# Patient Record
Sex: Female | Born: 1980 | Race: Black or African American | Hispanic: No | Marital: Married | Smoking: Never smoker
Health system: Southern US, Community
[De-identification: ages and names within clinical notes are randomized; demographics above are authoritative.]

## PROBLEM LIST (undated history)

## (undated) HISTORY — PX: KNEE ARTHROSCOPY W/ ACL RECONSTRUCTION: SHX1858

---

## 2016-09-01 ENCOUNTER — Emergency Department
Admission: EM | Admit: 2016-09-01 | Discharge: 2016-09-01 | Disposition: A | Discharge status: Home / Self Care | Payer: BC Managed Care – PPO | Attending: Emergency Medicine | Admitting: Emergency Medicine

## 2016-09-01 ENCOUNTER — Encounter: Payer: Self-pay | Admitting: Emergency Medicine

## 2016-09-01 ENCOUNTER — Emergency Department: Payer: BC Managed Care – PPO

## 2016-09-01 DIAGNOSIS — S83422A Sprain of lateral collateral ligament of left knee, initial encounter: Secondary | ICD-10-CM | POA: Diagnosis not present

## 2016-09-01 DIAGNOSIS — Y9367 Activity, basketball: Secondary | ICD-10-CM | POA: Diagnosis not present

## 2016-09-01 DIAGNOSIS — S8992XA Unspecified injury of left lower leg, initial encounter: Secondary | ICD-10-CM | POA: Diagnosis present

## 2016-09-01 DIAGNOSIS — X501XXA Overexertion from prolonged static or awkward postures, initial encounter: Secondary | ICD-10-CM | POA: Diagnosis not present

## 2016-09-01 DIAGNOSIS — Y929 Unspecified place or not applicable: Secondary | ICD-10-CM | POA: Diagnosis not present

## 2016-09-01 DIAGNOSIS — M25562 Pain in left knee: Secondary | ICD-10-CM

## 2016-09-01 DIAGNOSIS — Y998 Other external cause status: Secondary | ICD-10-CM | POA: Insufficient documentation

## 2016-09-01 MED ORDER — NAPROXEN 500 MG PO TABS: 500.0000 mg | ORAL_TABLET | Freq: Two times a day (BID) | ORAL | 0 refills | Status: DC

## 2016-09-01 NOTE — Discharge Instructions (Signed)
Follow-up with Dr. Hyacinth MeekerMiller if any continued problems. Naproxen 500 mg twice a day with food. Wear knee immobilizer when up walking. Ice if needed for swelling or pain. No sports for 2 weeks.

## 2016-09-01 NOTE — ED Provider Notes (Signed)
[facility] Emergency Department Provider Note ____________________________________________  Time seen: 9:30 AM  I have reviewed the triage vital signs and the nursing notes.  HISTORY  Chief Complaint  Knee Pain   HPI Holly Williamson is a 36 y.o. female is here complaining of left knee pain. Patient states that she was playing basketball when she injured her knee 2 days ago. Patient states she is infrequently taken ibuprofen has not taken any last 24 hours. Patient is continue to ambulate without any assistance. Patient states that she has had problems with her right knee in the past with a torn anterior cruciate ligament. Currently she rates her pain as 9/10.  History reviewed. No pertinent past medical history.  There are no active problems to display for this patient.   Past Surgical History:  Procedure Laterality Date  . KNEE ARTHROSCOPY W/ ACL RECONSTRUCTION      Prior to Admission medications   Medication Sig Start Date End Date Taking? Authorizing Provider  naproxen (NAPROSYN) 500 MG tablet Take 1 tablet (500 mg total) by mouth 2 (two) times daily with a meal. 09/01/16   Tommi Rumps, PA-C    Allergies Patient has no known allergies.  No family history on file.  Social History Social History  Substance Use Topics  . Smoking status: Never Smoker  . Smokeless tobacco: Not on file  . Alcohol use Not on file    Review of Systems  Constitutional: Negative for fever. Cardiovascular: Negative for chest pain. Respiratory: Negative for shortness of breath. Musculoskeletal: Positive right knee pain. Skin: Negative for rash. Neurological: Negative for headaches, focal weakness or numbness. ____________________________________________  PHYSICAL EXAM:  VITAL SIGNS: ED Triage Vitals  Enc Vitals Group     BP 09/01/16 0843 127/83     Pulse Rate 09/01/16 0843 81     Resp 09/01/16 0843 20     Temp 09/01/16 0843 98.4 F (36.9 C)     Temp  Source 09/01/16 0843 Oral     SpO2 09/01/16 0843 100 %     Weight 09/01/16 0844 230 lb (104.3 kg)     Height 09/01/16 0844 5\' 6"  (1.676 m)     Head Circumference --      Peak Flow --      Pain Score 09/01/16 0843 9     Pain Loc --      Pain Edu? --      Excl. in GC? --     Constitutional: Alert and oriented. Well appearing and in no distress. Head: Normocephalic and atraumatic. Eyes: Conjunctivae are normal.  Neck: No stridor Cardiovascular: Normal rate, regular rhythm. Normal distal pulses. Respiratory: Normal respiratory effort.  Musculoskeletal: On examination of the left knee there is no gross deformity noted. There is no effusion present. Mild crepitus is noted with range of motion. Patient is able to flex and extend with out difficulty. Lateral ligament slightly unstable.  Neurologic:  Normal gait without ataxia. Normal speech and language. No gross focal neurologic deficits are appreciated. Skin:  Skin is warm, dry and intact. No ecchymosis, abrasions, erythema was noted. Psychiatric: Mood and affect are normal. Patient exhibits appropriate insight and judgment. 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 No acute osseous abnormality  identified.   I, Tommi Rumps, personally viewed and evaluated these images (plain  radiographs) as part of my medical decision making, as well as reviewing the written report by the radiologist. ____________________________________________   INITIAL IMPRESSION / ASSESSMENT AND PLAN / ED COURSE  Patient is given naproxen 500 mg twice a day for the food today and taking today. She is also placed in a knee immobilizer. She is instructed to use ice if needed for swelling or pain. She is to follow-up with Dr. Hyacinth MeekerMiller if any  continued problems [facility]. No sports for 2 weeks.    ____________________________________________  FINAL CLINICAL IMPRESSION(S) / ED DIAGNOSES  Final diagnoses:  Acute pain of left knee  Sprain of lateral collateral ligament of left knee, initial encounter     Tommi RumpsSummers, Rhonda L, PA-C 09/01/16 1050    Emily FilbertWilliams, Jonathan E, MD 09/01/16 1128

## 2016-09-01 NOTE — ED Notes (Signed)
See triage note states she twisted her left knee on sat while playing b/b  having pain to lateral and posterior knee  Unable to bear full wt d/t pain

## 2016-09-01 NOTE — ED Triage Notes (Signed)
L knee pain since injured playing basketball 2 days ago.

## 2016-12-26 ENCOUNTER — Emergency Department: Payer: BC Managed Care – PPO

## 2016-12-26 ENCOUNTER — Emergency Department
Admission: EM | Admit: 2016-12-26 | Discharge: 2016-12-26 | Disposition: A | Discharge status: Home / Self Care | Payer: BC Managed Care – PPO | Attending: Emergency Medicine | Admitting: Emergency Medicine

## 2016-12-26 ENCOUNTER — Encounter: Payer: Self-pay | Admitting: Emergency Medicine

## 2016-12-26 DIAGNOSIS — Z79899 Other long term (current) drug therapy: Secondary | ICD-10-CM | POA: Diagnosis not present

## 2016-12-26 DIAGNOSIS — R05 Cough: Secondary | ICD-10-CM | POA: Diagnosis present

## 2016-12-26 DIAGNOSIS — J40 Bronchitis, not specified as acute or chronic: Secondary | ICD-10-CM | POA: Insufficient documentation

## 2016-12-26 MED ORDER — HYDROCOD POLST-CPM POLST ER 10-8 MG/5ML PO SUER: 5.0000 mL | Freq: Two times a day (BID) | ORAL | 0 refills | Status: DC

## 2016-12-26 MED ORDER — ALBUTEROL SULFATE HFA 108 (90 BASE) MCG/ACT IN AERS: 2.0000 | INHALATION_SPRAY | RESPIRATORY_TRACT | 0 refills | Status: AC | PRN

## 2016-12-26 MED ORDER — HYDROCOD POLST-CPM POLST ER 10-8 MG/5ML PO SUER
5.0000 mL | Freq: Once | ORAL | Status: AC
  Administered 2016-12-26: 5 mL via ORAL
  Filled 2016-12-26: qty 5

## 2016-12-26 MED ORDER — METHYLPREDNISOLONE 4 MG PO TBPK: ORAL_TABLET | ORAL | 0 refills | Status: DC

## 2016-12-26 MED ORDER — PREDNISONE 20 MG PO TABS
40.0000 mg | ORAL_TABLET | Freq: Once | ORAL | Status: AC
  Administered 2016-12-26: 40 mg via ORAL
  Filled 2016-12-26: qty 2

## 2016-12-26 NOTE — ED Provider Notes (Signed)
[facility] Emergency Department Provider Note   ____________________________________________   First MD Initiated Contact with Patient 12/26/16 [phone]     (approximate)  I have reviewed the triage vital signs and the nursing notes.   HISTORY  Chief Complaint Cough    HPI Holly Williamson is a 36 y.o. female who presents to the ED from home with a chief complaint of cough. Patient reports a two-week history of cough, initially dry, now productive of yellow sputum. Symptoms associated with chest tightness only when coughing. Complains of subjective fevers at home and occasional wheezing noise in her lungs.. Denies associated shortness of breath, abdominal pain, nausea, vomiting.   Past medical history None  There are no active problems to display for this patient.   Past Surgical History:  Procedure Laterality Date  . KNEE ARTHROSCOPY W/ ACL RECONSTRUCTION      Prior to Admission medications   Medication Sig Start Date End Date Taking? Authorizing Provider  albuterol (PROVENTIL HFA;VENTOLIN HFA) 108 (90 Base) MCG/ACT inhaler Inhale 2 puffs into the lungs every 4 (four) hours as needed for wheezing or shortness of breath. 12/26/16   Irean Hong, MD  chlorpheniramine-HYDROcodone (TUSSIONEX PENNKINETIC ER) 10-8 MG/5ML SUER Take 5 mLs by mouth 2 (two) times daily. 12/26/16   Irean Hong, MD  methylPREDNISolone (MEDROL DOSEPAK) 4 MG TBPK tablet Take as directed 12/26/16   Irean Hong, MD  naproxen (NAPROSYN) 500 MG tablet Take 1 tablet (500 mg total) by mouth 2 (two) times daily with a meal. 09/01/16   Tommi Rumps, PA-C    Allergies Patient has no known allergies.  No family history on file.  Social History Social History  Substance Use Topics  . Smoking status: Never Smoker  . Smokeless tobacco: Never Used  . Alcohol use Not on file  smoker  Review of Systems  Constitutional: positive for subjective fever/chills. Eyes: No visual  changes. ENT: No sore throat. Cardiovascular: Denies chest pain. Respiratory: positive for productive cough and wheezing. Denies shortness of breath. Gastrointestinal: No abdominal pain.  No nausea, no vomiting.  No diarrhea.  No constipation. Genitourinary: Negative for dysuria. Musculoskeletal: Negative for back pain. Skin: Negative for rash. Neurological: Negative for headaches, focal weakness or numbness.   ____________________________________________   PHYSICAL EXAM:  VITAL SIGNS: ED Triage Vitals  Enc Vitals Group     BP 12/26/16 0509 (!) 145/99     Pulse Rate 12/26/16 0509 87     Resp 12/26/16 0509 13     Temp 12/26/16 0509 98.2 F (36.8 C)     Temp Source 12/26/16 0509 Oral     SpO2 12/26/16 0509 99 %     Weight 12/26/16 0502 210 lb (95.3 kg)     Height 12/26/16 0502  (1.676 m)     Head Circumference --      Peak Flow --      Pain Score 12/26/16 0502 9     Pain Loc --      Pain Edu? --      Excl. in GC? --     Constitutional: Alert and oriented. Well appearing and in no acute distress. Eyes: Conjunctivae are normal. PERRL. EOMI. Head: Atraumatic. Nose: No congestion/rhinnorhea. Mouth/Throat: Mucous membranes are moist.  Oropharynx non-erythematous. Neck: No stridor.   Cardiovascular: Normal rate, regular rhythm. Grossly normal heart sounds.  Good peripheral circulation. Respiratory: Normal respiratory effort.  No retractions. Lungs CTAB. Loose cough noted. Gastrointestinal: Soft and nontender. No distention.  No abdominal bruits. No CVA tenderness. Musculoskeletal: No lower extremity tenderness nor edema.  No joint effusions. Neurologic:  Normal speech and language. No gross focal neurologic deficits are appreciated. No gait instability. Skin:  Skin is warm, dry and intact. No rash noted. Psychiatric: Mood and affect are normal. Speech and behavior are normal.  ____________________________________________   LABS (all labs ordered are listed, but only  abnormal results are displayed)  Labs Reviewed - No data to display ____________________________________________  EKG  ED ECG REPORT I, Fong Mccarry J, the attending physician, personally viewed and interpreted this ECG.   Date: 12/26/2016  EKG Time: 0508  Rate: 79  Rhythm: normal EKG, normal sinus rhythm  Axis: normal  Intervals:none  ST&T Change: nonspecific  ____________________________________________  RADIOLOGY  Dg Chest 2 View  Result Date: 12/26/2016 CLINICAL DATA:  Cough and chest pain. EXAM: CHEST  2 VIEW COMPARISON:  None. FINDINGS: The cardiomediastinal contours are normal. Mild central bronchial thickening. Pulmonary vasculature is normal. No consolidation, pleural effusion, or pneumothorax. No acute osseous abnormalities are seen. IMPRESSION: Mild central bronchial thickening. Electronically Signed   By: Rubye Oaks M.D.   On: 12/26/2016 05:39    ____________________________________________   PROCEDURES  Procedure(s) performed: None  Procedures  Critical Care performed: No  ____________________________________________   INITIAL IMPRESSION / ASSESSMENT AND PLAN / ED COURSE  Pertinent labs & imaging results that were available during my care of the patient were reviewed by me and considered in my medical decision making (see chart for details).  36 year old female who presents with bronchitis. Will initiate Medrol Dosepak, albuterol inhaler as needed, Tussionex as needed for cough. Strict return precautions given. Patient and family member verbalize understanding and agree with plan of care.      ____________________________________________   FINAL CLINICAL IMPRESSION(S) / ED DIAGNOSES  Final diagnoses:  Bronchitis      NEW MEDICATIONS STARTED DURING THIS VISIT:  New Prescriptions   ALBUTEROL (PROVENTIL HFA;VENTOLIN HFA) 108 (90 BASE) MCG/ACT INHALER    Inhale 2 puffs into the lungs every 4 (four) hours as needed for wheezing or shortness  of breath.   CHLORPHENIRAMINE-HYDROCODONE (TUSSIONEX PENNKINETIC ER) 10-8 MG/5ML SUER    Take 5 mLs by mouth 2 (two) times daily.   METHYLPREDNISOLONE (MEDROL DOSEPAK) 4 MG TBPK TABLET    Take as directed     Note:  This document was prepared using Dragon voice recognition software and may include unintentional dictation errors.    Irean Hong, MD 12/26/16 [phone]

## 2016-12-26 NOTE — Discharge Instructions (Signed)
1. Take steroid taper as prescribed (Medrol Dosepak). 2. Use albuterol inhaler 2 puffs every 4 hours as needed for wheezing. 3. You may take Tussionex as needed for cough. 4. Return to the ER for worsening symptoms, persistent vomiting, difficulty breathing or other concerns.

## 2016-12-26 NOTE — ED Triage Notes (Signed)
Patient ambulatory to triage with steady gait, without difficulty or distress noted; pt reports prod cough yellow sputum with chest tightness

## 2017-04-21 ENCOUNTER — Emergency Department: Payer: BC Managed Care – PPO

## 2017-04-21 ENCOUNTER — Encounter: Payer: Self-pay | Admitting: Emergency Medicine

## 2017-04-21 ENCOUNTER — Other Ambulatory Visit: Payer: Self-pay

## 2017-04-21 ENCOUNTER — Emergency Department
Admission: EM | Admit: 2017-04-21 | Discharge: 2017-04-21 | Disposition: A | Discharge status: Home / Self Care | Payer: BC Managed Care – PPO | Attending: Emergency Medicine | Admitting: Emergency Medicine

## 2017-04-21 DIAGNOSIS — Z79891 Long term (current) use of opiate analgesic: Secondary | ICD-10-CM | POA: Diagnosis not present

## 2017-04-21 DIAGNOSIS — Y9367 Activity, basketball: Secondary | ICD-10-CM | POA: Insufficient documentation

## 2017-04-21 DIAGNOSIS — S161XXA Strain of muscle, fascia and tendon at neck level, initial encounter: Secondary | ICD-10-CM | POA: Insufficient documentation

## 2017-04-21 DIAGNOSIS — Y92310 Basketball court as the place of occurrence of the external cause: Secondary | ICD-10-CM | POA: Diagnosis not present

## 2017-04-21 DIAGNOSIS — Y999 Unspecified external cause status: Secondary | ICD-10-CM | POA: Insufficient documentation

## 2017-04-21 DIAGNOSIS — X503XXA Overexertion from repetitive movements, initial encounter: Secondary | ICD-10-CM | POA: Insufficient documentation

## 2017-04-21 DIAGNOSIS — Z791 Long term (current) use of non-steroidal anti-inflammatories (NSAID): Secondary | ICD-10-CM | POA: Insufficient documentation

## 2017-04-21 DIAGNOSIS — S199XXA Unspecified injury of neck, initial encounter: Secondary | ICD-10-CM | POA: Diagnosis present

## 2017-04-21 MED ORDER — TRAMADOL HCL 50 MG PO TABS: 50.0000 mg | ORAL_TABLET | Freq: Four times a day (QID) | ORAL | 0 refills | Status: DC | PRN

## 2017-04-21 MED ORDER — IBUPROFEN 600 MG PO TABS: 600.0000 mg | ORAL_TABLET | Freq: Three times a day (TID) | ORAL | 0 refills | Status: DC | PRN

## 2017-04-21 MED ORDER — CYCLOBENZAPRINE HCL 10 MG PO TABS: 10.0000 mg | ORAL_TABLET | Freq: Three times a day (TID) | ORAL | 0 refills | Status: DC | PRN

## 2017-04-21 NOTE — ED Triage Notes (Signed)
Pt to ed with c/o back pain and neck pain x 2 weeks.  Pt states she played basketball about 2 weeks ago and has had pain since.

## 2017-04-21 NOTE — ED Provider Notes (Signed)
[facility] Emergency Department Provider Note   ____________________________________________   First MD Initiated Contact with Patient 04/21/17 1640     (approximate)  I have reviewed the triage vital signs and the nursing notes.   HISTORY  Chief Complaint Back Pain    HPI Holly Williamson is a 37 y.o. female patient complain of neck and upper back pain for 2 weeks. Onset of complaint of pain started after playing basketball 2 weeks ago. Patient stated decreased range of motion with left lateral movements of flexion of the neck. Patient denies loss of sensation. Patient stated pain radiates to the right upper shoulder. Patient rates pain as a 10 over 10. Patient described a pain as "achy". No palliative measures for complaint.   History reviewed. No pertinent past medical history.  There are no active problems to display for this patient.   Past Surgical History:  Procedure Laterality Date  . KNEE ARTHROSCOPY W/ ACL RECONSTRUCTION      Prior to Admission medications   Medication Sig Start Date End Date Taking? Authorizing Provider  albuterol (PROVENTIL HFA;VENTOLIN HFA) 108 (90 Base) MCG/ACT inhaler Inhale 2 puffs into the lungs every 4 (four) hours as needed for wheezing or shortness of breath. 12/26/16   Irean HongSung, Jade J, MD  chlorpheniramine-HYDROcodone (TUSSIONEX PENNKINETIC ER) 10-8 MG/5ML SUER Take 5 mLs by mouth 2 (two) times daily. 12/26/16   Irean HongSung, Jade J, MD  cyclobenzaprine (FLEXERIL) 10 MG tablet Take 1 tablet (10 mg total) by mouth 3 (three) times daily as needed. 04/21/17   Joni ReiningSmith, Genesys Coggeshall K, PA-C  ibuprofen (ADVIL,MOTRIN) 600 MG tablet Take 1 tablet (600 mg total) by mouth every 8 (eight) hours as needed. 04/21/17   Joni ReiningSmith, Maikel Neisler K, PA-C  methylPREDNISolone (MEDROL DOSEPAK) 4 MG TBPK tablet Take as directed 12/26/16   Irean HongSung, Jade J, MD  naproxen (NAPROSYN) 500 MG tablet Take 1 tablet (500 mg total) by mouth 2 (two) times daily with a meal. 09/01/16    Tommi RumpsSummers, Rhonda L, PA-C  traMADol (ULTRAM) 50 MG tablet Take 1 tablet (50 mg total) by mouth every 6 (six) hours as needed. 04/21/17 04/21/18  Joni ReiningSmith, Zahira Brummond K, PA-C    Allergies Patient has no known allergies.  No family history on file.  Social History Social History   Tobacco Use  . Smoking status: Never Smoker  . Smokeless tobacco: Never Used  Substance Use Topics  . Alcohol use: Yes    Frequency: Never  . Drug use: No    Review of Systems Constitutional: No fever/chills Eyes: No visual changes. ENT: No sore throat. Cardiovascular: Denies chest pain. Respiratory: Denies shortness of breath. Gastrointestinal: No abdominal pain.  No nausea, no vomiting.  No diarrhea.  No constipation. Genitourinary: Negative for dysuria. Musculoskeletal: Positive for neck pain. Skin: Negative for rash. Neurological: Negative for headaches, focal weakness or numbness.   ____________________________________________   PHYSICAL EXAM:  VITAL SIGNS: ED Triage Vitals  Enc Vitals Group     BP 04/21/17 1623 (!) 152/96     Pulse Rate 04/21/17 1623 82     Resp 04/21/17 1623 18     Temp 04/21/17 1623 98.2 F (36.8 C)     Temp Source 04/21/17 1623 Oral     SpO2 04/21/17 1623 100 %     Weight 04/21/17 1605 210 lb (95.3 kg)     Height 04/21/17 1605 5\' 6"  (1.676 m)     Head Circumference --      Peak Flow --  Pain Score 04/21/17 1605 10     Pain Loc --      Pain Edu? --      Excl. in GC? --    Constitutional: Alert and oriented. Well appearing and in no acute distress. Mouth/Throat: Mucous membranes are moist.  Oropharynx non-erythematous. Neck: No stridor.  No cervical spine tenderness to palpation. Decreased range  of motion with lateral movements. Cardiovascular: Normal rate, regular rhythm. Grossly normal heart sounds.  Good peripheral circulation. Respiratory: Normal respiratory effort.  No retractions. Lungs CTAB. Gastrointestinal: Soft and nontender. No distention. No abdominal  bruits. No CVA tenderness. Musculoskeletal: No lower extremity tenderness nor edema.  No joint effusions. Neurologic:  Normal speech and language. No gross focal neurologic deficits are appreciated. No gait instability. Skin:  Skin is warm, dry and intact. No rash noted. Psychiatric: Mood and affect are normal. Speech and behavior are normal.  ____________________________________________   LABS (all labs ordered are listed, but only abnormal results are displayed)  Labs Reviewed - No data to display ____________________________________________  EKG   ____________________________________________  RADIOLOGY  Dg Cervical Spine Complete  Result Date: 04/21/2017 CLINICAL DATA:  neck pain x 2 weeks. Pt states she played basketball about 2 weeks ago and has had pain since, pain with turning head EXAM: CERVICAL SPINE - COMPLETE 4+ VIEW COMPARISON:  None. FINDINGS: There is no evidence of cervical spine fracture or prevertebral soft tissue swelling. Alignment is normal. No other significant bone abnormalities are identified. Missing teeth and dental restoration. IMPRESSION: Negative cervical spine radiographs. Electronically Signed   By: Corlis Leak M.D.   On: 04/21/2017 17:22    ____________________________________________   PROCEDURES  Procedure(s) performed: None  Procedures  Critical Care performed: No  ____________________________________________   INITIAL IMPRESSION / ASSESSMENT AND PLAN / ED COURSE  As part of my medical decision making, I reviewed the following data within the electronic MEDICAL RECORD NUMBER   Lateral neck pain secondary to strain. Discussed negative x-ray finding with patient. Patient given discharge care instructions. Patient advised to take medication as directed and follow-up with the [facility] clinic if condition persists.      ____________________________________________   FINAL CLINICAL IMPRESSION(S) / ED DIAGNOSES  Final diagnoses:  Cervical  strain, acute, initial encounter     ED Discharge Orders        Ordered    traMADol (ULTRAM) 50 MG tablet  Every 6 hours PRN     04/21/17 1737    cyclobenzaprine (FLEXERIL) 10 MG tablet  3 times daily PRN     04/21/17 1737    ibuprofen (ADVIL,MOTRIN) 600 MG tablet  Every 8 hours PRN     04/21/17 1737       Note:  This document was prepared using Dragon voice recognition software and may include unintentional dictation errors.    Joni Reining, PA-C 04/21/17 1740    Merrily Brittle, MD 04/21/17 Jerene Bears

## 2017-04-21 NOTE — ED Notes (Signed)
Pt with neck pain x 2 weeks. radiates down right arm. No weakness or loss on motion.

## 2017-06-03 ENCOUNTER — Emergency Department
Admission: EM | Admit: 2017-06-03 | Discharge: 2017-06-03 | Disposition: A | Discharge status: Home / Self Care | Payer: BC Managed Care – PPO | Attending: Emergency Medicine | Admitting: Emergency Medicine

## 2017-06-03 ENCOUNTER — Emergency Department: Payer: BC Managed Care – PPO

## 2017-06-03 ENCOUNTER — Other Ambulatory Visit: Payer: Self-pay

## 2017-06-03 DIAGNOSIS — M25562 Pain in left knee: Secondary | ICD-10-CM | POA: Insufficient documentation

## 2017-06-03 DIAGNOSIS — Z79899 Other long term (current) drug therapy: Secondary | ICD-10-CM | POA: Diagnosis not present

## 2017-06-03 MED ORDER — MELOXICAM 15 MG PO TABS: 15.0000 mg | ORAL_TABLET | Freq: Every day | ORAL | 0 refills | Status: DC

## 2017-06-03 MED ORDER — TRAMADOL HCL 50 MG PO TABS: 50.0000 mg | ORAL_TABLET | Freq: Four times a day (QID) | ORAL | 0 refills | Status: DC | PRN

## 2017-06-03 NOTE — ED Provider Notes (Signed)
[facility] Emergency Department Provider Note ____________________________________________  Time seen: Approximately 4:58 PM  I have reviewed the triage vital signs and the nursing notes.   HISTORY  Chief Complaint Knee Pain    HPI Holly Williamson is a 37 y.o. female who presents to the emergency department for evaluation and treatment of left knee pain. She was moving something into a [employer] last night and the dolly rolled back and hit her knee causing pain and swelling. She has a history of left ACL repair. Ibuprofen 600mg  is not helping her pain.  No past medical history on file.  There are no active problems to display for this patient.   Past Surgical History:  Procedure Laterality Date  . KNEE ARTHROSCOPY W/ ACL RECONSTRUCTION      Prior to Admission medications   Medication Sig Start Date End Date Taking? Authorizing Provider  albuterol (PROVENTIL HFA;VENTOLIN HFA) 108 (90 Base) MCG/ACT inhaler Inhale 2 puffs into the lungs every 4 (four) hours as needed for wheezing or shortness of breath. 12/26/16   Irean HongSung, Jade J, MD  chlorpheniramine-HYDROcodone (TUSSIONEX PENNKINETIC ER) 10-8 MG/5ML SUER Take 5 mLs by mouth 2 (two) times daily. 12/26/16   Irean HongSung, Jade J, MD  cyclobenzaprine (FLEXERIL) 10 MG tablet Take 1 tablet (10 mg total) by mouth 3 (three) times daily as needed. 04/21/17   Joni ReiningSmith, Ronald K, PA-C  ibuprofen (ADVIL,MOTRIN) 600 MG tablet Take 1 tablet (600 mg total) by mouth every 8 (eight) hours as needed. 04/21/17   Joni ReiningSmith, Ronald K, PA-C  methylPREDNISolone (MEDROL DOSEPAK) 4 MG TBPK tablet Take as directed 12/26/16   Irean HongSung, Jade J, MD  naproxen (NAPROSYN) 500 MG tablet Take 1 tablet (500 mg total) by mouth 2 (two) times daily with a meal. 09/01/16   Tommi RumpsSummers, Rhonda L, PA-C  traMADol (ULTRAM) 50 MG tablet Take 1 tablet (50 mg total) by mouth every 6 (six) hours as needed. 04/21/17 04/21/18  Joni ReiningSmith, Ronald K, PA-C    Allergies Patient has no known  allergies.  No family history on file.  Social History Social History   Tobacco Use  . Smoking status: Never Smoker  . Smokeless tobacco: Never Used  Substance Use Topics  . Alcohol use: Yes    Frequency: Never  . Drug use: No    Review of Systems Constitutional: Negative for recent illness or injury Cardiovascular: Negative for chest pain or shortness of breath Respiratory: Negative for cough Musculoskeletal: Positive for left knee pain Skin: Negative for rash, lesion, or wound Neurological: Negative for paresthesias.  ____________________________________________   PHYSICAL EXAM:  VITAL SIGNS: ED Triage Vitals  Enc Vitals Group     BP 06/03/17 1600 (!) 144/85     Pulse Rate 06/03/17 1600 76     Resp 06/03/17 1600 18     Temp 06/03/17 1600 98.7 F (37.1 C)     Temp Source 06/03/17 1600 Oral     SpO2 06/03/17 1600 100 %     Weight 06/03/17 1601 212 lb (96.2 kg)     Height 06/03/17 1601 5\' 6"  (1.676 m)     Head Circumference --      Peak Flow --      Pain Score 06/03/17 1601 9     Pain Loc --      Pain Edu? --      Excl. in GC? --     Constitutional: Alert and oriented. Well appearing and in no acute distress. Eyes: Conjunctivae are clear without discharge or  drainage Head: Atraumatic Neck: Supple. Respiratory: Respirations are even and unlabored. Musculoskeletal: Pain in the left knee increases with flexion.  Pain increases with varus stress. Negative Lachman's test. Negative ballottement.  Neurologic: Motor and sensory function is intact.  Skin: Intact, specifically over the left knee. No significant edema or joint effusion appreciated.  Psychiatric: Affect and behavior are intact.  ____________________________________________   LABS (all labs ordered are listed, but only abnormal results are displayed)  Labs Reviewed - No data to display ____________________________________________  RADIOLOGY  Image of the left knee shows osteoarthritis, but is  otherwise negative for acute abnormality per radiology. I, Kem Boroughs, personally viewed and evaluated these images (plain radiographs) as part of my medical decision making, as well as reviewing the written report by the radiologist.  ___________________________________________   PROCEDURES  Procedures  ____________________________________________   INITIAL IMPRESSION / ASSESSMENT AND PLAN / ED COURSE  Holly Williamson is a 37 y.o. female who presents to the emergency department for evaluation of left knee pain.  Patient states that she was moving something up into a [employer] truck and the dolly rolled back and struck her in the knee.  She has had pain with ambulation and flexion since that time.  She has taken ibuprofen 600 mg without relief.  Images of the left knee today show no concern for acute bony abnormality.  She was placed in a knee immobilizer and prescriptions for meloxicam and tramadol are to be given.  She is to follow-up with the orthopedic doctor for symptoms that are not improving over the week.  She is to return to the emergency department for symptoms of change or worsen if she is unable to schedule an appointment.  Medications - No data to display  Pertinent labs & imaging results that were available during my care of the patient were reviewed by me and considered in my medical decision making (see chart for details).  _________________________________________   FINAL CLINICAL IMPRESSION(S) / ED DIAGNOSES  Final diagnoses:  Acute pain of left knee    ED Discharge Orders    None       If controlled substance prescribed during this visit, 12 month history viewed on the [facility] prior to issuing an initial prescription for Schedule II or III opiod.    Chinita Pester, FNP 06/03/17 Ramiro Harvest, [place], MD 06/03/17 [phone]

## 2017-06-03 NOTE — ED Triage Notes (Signed)
She arrives today ambulatory to triage with reports of left sided knee pain  Pt was moving something onto a [employer] truck last pm when the dolly slid and resulted in injury to her left knee  Edema and 9/10 pain reported

## 2017-07-06 ENCOUNTER — Emergency Department: Payer: BC Managed Care – PPO

## 2017-07-06 ENCOUNTER — Encounter: Payer: Self-pay | Admitting: Emergency Medicine

## 2017-07-06 ENCOUNTER — Emergency Department
Admission: EM | Admit: 2017-07-06 | Discharge: 2017-07-06 | Disposition: A | Discharge status: Home / Self Care | Payer: BC Managed Care – PPO | Attending: Emergency Medicine | Admitting: Emergency Medicine

## 2017-07-06 ENCOUNTER — Other Ambulatory Visit: Payer: Self-pay

## 2017-07-06 DIAGNOSIS — M25562 Pain in left knee: Secondary | ICD-10-CM | POA: Diagnosis present

## 2017-07-06 DIAGNOSIS — Z79899 Other long term (current) drug therapy: Secondary | ICD-10-CM | POA: Diagnosis not present

## 2017-07-06 DIAGNOSIS — M1712 Unilateral primary osteoarthritis, left knee: Secondary | ICD-10-CM | POA: Diagnosis not present

## 2017-07-06 MED ORDER — HYDROCODONE-ACETAMINOPHEN 5-325 MG PO TABS: 1.0000 | ORAL_TABLET | Freq: Four times a day (QID) | ORAL | 0 refills | Status: AC | PRN

## 2017-07-06 NOTE — ED Provider Notes (Signed)
[facility] Emergency Department Provider Note ____________________________________________  Time seen: Approximately 3:44 PM  I have reviewed the triage vital signs and the nursing notes.   HISTORY  Chief Complaint Knee Pain    HPI Gurney Maxiniletha Situ is a 37 y.o. female who presents to the emergency department for evaluation and treatment of left knee pain.  No specific injury.  She states that she has been walking more and has played some basketball but has not sustained any falls or twisting injuries.  She does state that she had a partial ACL tear in 1998 and this feels similar. She is taking meloxicam and tramadol without relief. History reviewed. No pertinent past medical history.  There are no active problems to display for this patient.   Past Surgical History:  Procedure Laterality Date  . KNEE ARTHROSCOPY W/ ACL RECONSTRUCTION      Prior to Admission medications   Medication Sig Start Date End Date Taking? Authorizing Provider  albuterol (PROVENTIL HFA;VENTOLIN HFA) 108 (90 Base) MCG/ACT inhaler Inhale 2 puffs into the lungs every 4 (four) hours as needed for wheezing or shortness of breath. 12/26/16   Irean HongSung, Jade J, MD  chlorpheniramine-HYDROcodone (TUSSIONEX PENNKINETIC ER) 10-8 MG/5ML SUER Take 5 mLs by mouth 2 (two) times daily. 12/26/16   Irean HongSung, Jade J, MD  cyclobenzaprine (FLEXERIL) 10 MG tablet Take 1 tablet (10 mg total) by mouth 3 (three) times daily as needed. 04/21/17   Joni ReiningSmith, Ronald K, PA-C  HYDROcodone-acetaminophen (NORCO/VICODIN) 5-325 MG tablet Take 1 tablet by mouth every 6 (six) hours as needed for moderate pain. 07/06/17 07/06/18  Le Faulcon, Rulon Eisenmengerari B, FNP  ibuprofen (ADVIL,MOTRIN) 600 MG tablet Take 1 tablet (600 mg total) by mouth every 8 (eight) hours as needed. 04/21/17   Joni ReiningSmith, Ronald K, PA-C  meloxicam (MOBIC) 15 MG tablet Take 1 tablet (15 mg total) by mouth daily. 06/03/17   Kem Boroughsriplett, Capone Schwinn B, FNP  methylPREDNISolone (MEDROL DOSEPAK) 4 MG TBPK  tablet Take as directed 12/26/16   Irean HongSung, Jade J, MD  naproxen (NAPROSYN) 500 MG tablet Take 1 tablet (500 mg total) by mouth 2 (two) times daily with a meal. 09/01/16   Tommi RumpsSummers, Rhonda L, PA-C    Allergies Patient has no known allergies.  History reviewed. No pertinent family history.  Social History Social History   Tobacco Use  . Smoking status: Never Smoker  . Smokeless tobacco: Never Used  Substance Use Topics  . Alcohol use: Yes    Frequency: Never  . Drug use: No    Review of Systems Constitutional: Negative for fever. Cardiovascular: Negative for chest pain. Respiratory: Negative for shortness of breath. Musculoskeletal: Positive for left knee pain. Skin: Negative for wounds or lesions.  Neurological: Negative for decrease in sensation  ____________________________________________   PHYSICAL EXAM:  VITAL SIGNS: ED Triage Vitals  Enc Vitals Group     BP 07/06/17 1534 121/87     Pulse Rate 07/06/17 1534 88     Resp 07/06/17 1534 20     Temp 07/06/17 1534 99.4 F (37.4 C)     Temp Source 07/06/17 1534 Oral     SpO2 07/06/17 1534 99 %     Weight 07/06/17 1535 220 lb (99.8 kg)     Height 07/06/17 1535 5\' 6"  (1.676 m)     Head Circumference --      Peak Flow --      Pain Score 07/06/17 1534 9     Pain Loc --  Pain Edu? --      Excl. in GC? --     Constitutional: Alert and oriented. Well appearing and in no acute distress. Eyes: Conjunctivae are clear without discharge or drainage Head: Atraumatic Neck: Unrestricted ROM observed. Respiratory: No cough. Respirations are even and unlabored. Musculoskeletal: Left knee pain with attempt to flex or fully extend. Prepatellar effusion noted. No crepitus. Neurologic: Motor and sensory is intact.  Skin: Intact without wound or lesion.  Psychiatric: Affect and behavior are appropriate.  ____________________________________________   LABS (all labs ordered are listed, but only abnormal results are  displayed)  Labs Reviewed - No data to display ____________________________________________  RADIOLOGY  Osteoarthritis of the left knee. No acute bony abnormality per radiology. ____________________________________________   PROCEDURES  Procedures  ____________________________________________   INITIAL IMPRESSION / ASSESSMENT AND PLAN / ED COURSE  Gudrun Axe is a 37 y.o. who presents to the emergency department for evaluation of left knee pain. X-ray and exam is consistent with pain secondary to arthritis.  Patient instructed to follow-up with orthopedics when possible.  She was also instructed to return to the emergency department for symptoms that change or worsen if unable schedule an appointment with orthopedics or primary care.  Medications - No data to display  Pertinent labs & imaging results that were available during my care of the patient were reviewed by me and considered in my medical decision making (see chart for details).  _________________________________________   FINAL CLINICAL IMPRESSION(S) / ED DIAGNOSES  Final diagnoses:  Osteoarthritis of left knee, unspecified osteoarthritis type    ED Discharge Orders        Ordered    HYDROcodone-acetaminophen (NORCO/VICODIN) 5-325 MG tablet  Every 6 hours PRN     07/06/17 1724       If controlled substance prescribed during this visit, 12 month history viewed on the [place] prior to issuing an initial prescription for Schedule II or III opiod.    Chinita Pester, FNP 07/06/17 1732    Arnaldo Natal, MD 07/07/17 [phone]

## 2017-07-06 NOTE — ED Triage Notes (Signed)
Pt reports that she noticed that her left knee is burning and achy. She reports that she injured her ACL on right in 1998 and it feels similar. Denies any injury. She reports she has been walking and a little basketball. Pt is able to ambulate without difficulty.

## 2017-07-06 NOTE — ED Notes (Signed)
See triage note presents with pain to left knee  States pain is lateral  And also having some burning type pain posterior knee  Unsure of injury  States she just did some walking yesterday

## 2017-07-06 NOTE — Discharge Instructions (Signed)
Please wear a neoprene knee brace when up and moving around.  Follow up with the orthopedic doctor.

## 2017-10-17 ENCOUNTER — Encounter: Payer: Self-pay | Admitting: Emergency Medicine

## 2017-10-17 ENCOUNTER — Emergency Department: Payer: BC Managed Care – PPO

## 2017-10-17 ENCOUNTER — Emergency Department
Admission: EM | Admit: 2017-10-17 | Discharge: 2017-10-17 | Disposition: A | Discharge status: Home / Self Care | Payer: BC Managed Care – PPO | Attending: Emergency Medicine | Admitting: Emergency Medicine

## 2017-10-17 DIAGNOSIS — M25562 Pain in left knee: Secondary | ICD-10-CM | POA: Insufficient documentation

## 2017-10-17 DIAGNOSIS — Z79899 Other long term (current) drug therapy: Secondary | ICD-10-CM | POA: Insufficient documentation

## 2017-10-17 MED ORDER — MELOXICAM 15 MG PO TABS: 15.0000 mg | ORAL_TABLET | Freq: Every day | ORAL | 1 refills | Status: AC

## 2017-10-17 NOTE — ED Triage Notes (Signed)
Patient states that she was helping a friend move today and twisted her left knee.

## 2017-10-17 NOTE — ED Provider Notes (Signed)
[facility] Emergency Department Provider Note  ____________________________________________  Time seen: Approximately 11:55 PM  I have reviewed the triage vital signs and the nursing notes.   HISTORY  Chief Complaint Knee Pain    HPI Holly Williamson is a 37 y.o. female presents to the emergency department with 8 out of 10 left knee pain that started after patient was helping her friend move.  Patient reports that her pain is localized to the medial aspect of the knee and worsened with extension at the knee.  Patient reports a locking and clicking sensation.  She denies prior traumas or recent falls.  No prior left knee surgeries.  Patient did report an extensive history of basketball in her youth.   History reviewed. No pertinent past medical history.  There are no active problems to display for this patient.   Past Surgical History:  Procedure Laterality Date  . KNEE ARTHROSCOPY W/ ACL RECONSTRUCTION      Prior to Admission medications   Medication Sig Start Date End Date Taking? Authorizing Provider  albuterol (PROVENTIL HFA;VENTOLIN HFA) 108 (90 Base) MCG/ACT inhaler Inhale 2 puffs into the lungs every 4 (four) hours as needed for wheezing or shortness of breath. 12/26/16   Irean HongSung, Jade J, MD  chlorpheniramine-HYDROcodone (TUSSIONEX PENNKINETIC ER) 10-8 MG/5ML SUER Take 5 mLs by mouth 2 (two) times daily. 12/26/16   Irean HongSung, Jade J, MD  cyclobenzaprine (FLEXERIL) 10 MG tablet Take 1 tablet (10 mg total) by mouth 3 (three) times daily as needed. 04/21/17   Joni ReiningSmith, Ronald K, PA-C  HYDROcodone-acetaminophen (NORCO/VICODIN) 5-325 MG tablet Take 1 tablet by mouth every 6 (six) hours as needed for moderate pain. 07/06/17 07/06/18  Triplett, Rulon Eisenmengerari B, FNP  ibuprofen (ADVIL,MOTRIN) 600 MG tablet Take 1 tablet (600 mg total) by mouth every 8 (eight) hours as needed. 04/21/17   Joni ReiningSmith, Ronald K, PA-C  meloxicam (MOBIC) 15 MG tablet Take 1 tablet (15 mg total) by mouth daily for 7  days. 10/17/17 10/24/17  Orvil FeilWoods, Daisja Kessinger M, PA-C  methylPREDNISolone (MEDROL DOSEPAK) 4 MG TBPK tablet Take as directed 12/26/16   Irean HongSung, Jade J, MD  naproxen (NAPROSYN) 500 MG tablet Take 1 tablet (500 mg total) by mouth 2 (two) times daily with a meal. 09/01/16   Tommi RumpsSummers, Rhonda L, PA-C    Allergies Patient has no known allergies.  No family history on file.  Social History Social History   Tobacco Use  . Smoking status: Never Smoker  . Smokeless tobacco: Never Used  Substance Use Topics  . Alcohol use: Yes    Frequency: Never  . Drug use: No     Review of Systems  Constitutional: No fever/chills Eyes: No visual changes. No discharge ENT: No upper respiratory complaints. Cardiovascular: no chest pain. Respiratory: no cough. No SOB. Gastrointestinal: No abdominal pain.  No nausea, no vomiting.  No diarrhea.  No constipation. Genitourinary: Negative for dysuria. No hematuria Musculoskeletal: Patient has left knee pain.  Skin: Negative for rash, abrasions, lacerations, ecchymosis. Neurological: Negative for headaches, focal weakness or numbness.  ____________________________________________   PHYSICAL EXAM:  VITAL SIGNS: ED Triage Vitals [10/17/17 2216]  Enc Vitals Group     BP 135/90     Pulse Rate 83     Resp 18     Temp 98.4 F (36.9 C)     Temp Source Oral     SpO2 99 %     Weight 220 lb (99.8 kg)     Height 5\' 6"  (1.676 m)  Head Circumference      Peak Flow      Pain Score 9     Pain Loc      Pain Edu?      Excl. in GC?      Constitutional: Alert and oriented. Well appearing and in no acute distress. Eyes: Conjunctivae are normal. PERRL. EOMI. Head: Atraumatic. Cardiovascular: Normal rate, regular rhythm. Normal S1 and S2.  Good peripheral circulation. Respiratory: Normal respiratory effort without tachypnea or retractions. Lungs CTAB. Good air entry to the bases with no decreased or absent breath sounds. Musculoskeletal: Patient has tenderness to  palpation over the medial aspect of the knee, left.  Left knee: Negative anterior and posterior drawer test.  Positive McMurray's.  Negative ballottement.  Negative  apprehension.  Palpable dorsalis pedis pulse, left. Neurologic:  Normal speech and language. No gross focal neurologic deficits are appreciated.  Skin:  Skin is warm, dry and intact. No rash noted. Psychiatric: Mood and affect are normal. Speech and behavior are normal. Patient exhibits appropriate insight and judgement.   ____________________________________________   LABS (all labs ordered are listed, but only abnormal results are displayed)  Labs Reviewed - No data to display ____________________________________________  EKG   ____________________________________________  RADIOLOGY I personally viewed and evaluated these images as part of my medical decision making, as well as reviewing the written report by the radiologist.  Dg Knee Complete 4 Views Left  Result Date: 10/17/2017 CLINICAL DATA:  Twisted LEFT knee while helping a friend move today EXAM: LEFT KNEE - COMPLETE 4+ VIEW COMPARISON:  07/06/2017 FINDINGS: Osseous mineralization normal. Joint space narrowing and spur formation at medial compartment. No acute fracture, dislocation, or bone destruction. No knee joint effusion. IMPRESSION: Degenerative changes LEFT knee without acute bony abnormalities. Electronically Signed   By: Ulyses Southward M.D.   On: 10/17/2017 22:47    ____________________________________________    PROCEDURES  Procedure(s) performed:    Procedures    Medications - No data to display   ____________________________________________   INITIAL IMPRESSION / ASSESSMENT AND PLAN / ED COURSE  Pertinent labs & imaging results that were available during my care of the patient were reviewed by me and considered in my medical decision making (see chart for details).  Review of the [place] CSRS was performed in accordance of the [facility] prior to  dispensing any controlled drugs.      Assessment and plan Left knee pain Patient presents to the emergency department with left knee pain that occurred after patient helped a friend move earlier today.  Differential diagnosis included meniscal tear versus ACL strain.  No acute fractures or abnormalities were identified on x-ray examination of the knee.  Patient was discharged with meloxicam and advised to follow-up with orthopedics.  All patient questions were answered.    ____________________________________________  FINAL CLINICAL IMPRESSION(S) / ED DIAGNOSES  Final diagnoses:  Acute pain of left knee      NEW MEDICATIONS STARTED DURING THIS VISIT:  ED Discharge Orders        Ordered    meloxicam (MOBIC) 15 MG tablet  Daily     10/17/17 2341          This chart was dictated using voice recognition software/Dragon. Despite best efforts to proofread, errors can occur which can change the meaning. Any change was purely unintentional.    Orvil Feil, PA-C 10/17/17 2358    Emily Filbert, MD 10/18/17 [phone]

## 2017-10-17 NOTE — ED Notes (Signed)
Patient reports left knee pain after helping friend move.  Reports throbbing pain.  Good left pedal pulse noted.

## 2018-05-24 ENCOUNTER — Other Ambulatory Visit: Payer: Self-pay

## 2018-05-24 DIAGNOSIS — J111 Influenza due to unidentified influenza virus with other respiratory manifestations: Secondary | ICD-10-CM | POA: Insufficient documentation

## 2018-05-24 DIAGNOSIS — Z79899 Other long term (current) drug therapy: Secondary | ICD-10-CM | POA: Insufficient documentation

## 2018-05-24 LAB — COMPREHENSIVE METABOLIC PANEL
ALBUMIN: 3.5 g/dL (ref 3.5–5.0)
ALT: 14 U/L (ref 0–44)
AST: 20 U/L (ref 15–41)
Alkaline Phosphatase: 64 U/L (ref 38–126)
Anion gap: 6 (ref 5–15)
BUN: 13 mg/dL (ref 6–20)
CHLORIDE: 109 mmol/L (ref 98–111)
CO2: 23 mmol/L (ref 22–32)
CREATININE: 0.95 mg/dL (ref 0.44–1.00)
Calcium: 8.5 mg/dL — ABNORMAL LOW (ref 8.9–10.3)
GFR calc Af Amer: >60 mL/min (ref >60)
GLUCOSE: 101 mg/dL — AB (ref 70–99)
Potassium: 3.5 mmol/L (ref 3.5–5.1)
Sodium: 138 mmol/L (ref 135–145)
Total Bilirubin: 0.4 mg/dL (ref 0.3–1.2)
Total Protein: 6.4 g/dL — ABNORMAL LOW (ref 6.5–8.1)

## 2018-05-24 LAB — CBC
HCT: 33.8 % — ABNORMAL LOW (ref 36.0–46.0)
Hemoglobin: 10.5 g/dL — ABNORMAL LOW (ref 12.0–15.0)
MCH: 26.3 pg (ref 26.0–34.0)
MCHC: 31.1 g/dL (ref 30.0–36.0)
MCV: 84.7 fL (ref 80.0–100.0)
NRBC: 0 % (ref 0.0–0.2)
PLATELETS: 348 10*3/uL (ref 150–400)
RBC: 3.99 MIL/uL (ref 3.87–5.11)
RDW: 14 % (ref 11.5–15.5)
WBC: 8.8 10*3/uL (ref 4.0–10.5)

## 2018-05-24 LAB — URINALYSIS, COMPLETE (UACMP) WITH MICROSCOPIC
BILIRUBIN URINE: NEGATIVE
Glucose, UA: NEGATIVE mg/dL
KETONES UR: NEGATIVE mg/dL
Nitrite: NEGATIVE
PH: 6 (ref 5.0–8.0)
PROTEIN: NEGATIVE mg/dL
Specific Gravity, Urine: 1.012 (ref 1.005–1.030)

## 2018-05-24 LAB — LIPASE, BLOOD: LIPASE: 27 U/L (ref 11–51)

## 2018-05-24 LAB — POCT PREGNANCY, URINE: PREG TEST UR: NEGATIVE

## 2018-05-24 NOTE — ED Triage Notes (Signed)
Pt in with co forceful cough and congestion for a few days, today started with n.v.d. no dysuria.

## 2018-05-25 ENCOUNTER — Emergency Department: Payer: Self-pay

## 2018-05-25 ENCOUNTER — Emergency Department
Admission: EM | Admit: 2018-05-25 | Discharge: 2018-05-25 | Disposition: A | Discharge status: Home / Self Care | Payer: Self-pay | Attending: Emergency Medicine | Admitting: Emergency Medicine

## 2018-05-25 DIAGNOSIS — R69 Illness, unspecified: Secondary | ICD-10-CM

## 2018-05-25 DIAGNOSIS — J111 Influenza due to unidentified influenza virus with other respiratory manifestations: Secondary | ICD-10-CM

## 2018-05-25 NOTE — ED Provider Notes (Signed)
[facility] Emergency Department Provider Note  ____________________________________________   First MD Initiated Contact with Patient 05/25/18 [phone]     (approximate)  I have reviewed the triage vital signs and the nursing notes.   HISTORY  Chief Complaint Emesis and Cough    HPI Holly Williamson is a 38 y.o. female with no chronic medical issues who presents for evaluation of several days of a constellation of symptoms including general malaise and fatigue, body aches, nasal congestion and runny nose, cough with some sensation of chest pressure, shortness of breath with exertion, nausea, vomiting, and diarrhea.  She reports that the symptoms were somewhat acute in onset about 3 days ago and nothing in particular makes it better or worse (other than exertion).  The GI symptoms got worse today with about 5 episodes of loose watery stool with no blood as well as one episode of vomiting.  She has not had much appetite but has tried to stay hydrated.  She interacts with the public and thinks that she has been around a few people that have been sick lately.  She has not had any fever or chills of which she is aware.  No past medical history on file.  There are no active problems to display for this patient.   Past Surgical History:  Procedure Laterality Date  . KNEE ARTHROSCOPY W/ ACL RECONSTRUCTION      Prior to Admission medications   Medication Sig Start Date End Date Taking? Authorizing Provider  albuterol (PROVENTIL HFA;VENTOLIN HFA) 108 (90 Base) MCG/ACT inhaler Inhale 2 puffs into the lungs every 4 (four) hours as needed for wheezing or shortness of breath. 12/26/16   Irean Hong, MD  chlorpheniramine-HYDROcodone (TUSSIONEX PENNKINETIC ER) 10-8 MG/5ML SUER Take 5 mLs by mouth 2 (two) times daily. 12/26/16   Irean Hong, MD  cyclobenzaprine (FLEXERIL) 10 MG tablet Take 1 tablet (10 mg total) by mouth 3 (three) times daily as needed. 04/21/17   Joni Reining,  PA-C  HYDROcodone-acetaminophen (NORCO/VICODIN) 5-325 MG tablet Take 1 tablet by mouth every 6 (six) hours as needed for moderate pain. 07/06/17 07/06/18  Triplett, Rulon Eisenmenger B, FNP  ibuprofen (ADVIL,MOTRIN) 600 MG tablet Take 1 tablet (600 mg total) by mouth every 8 (eight) hours as needed. 04/21/17   Joni Reining, PA-C  methylPREDNISolone (MEDROL DOSEPAK) 4 MG TBPK tablet Take as directed 12/26/16   Irean Hong, MD  naproxen (NAPROSYN) 500 MG tablet Take 1 tablet (500 mg total) by mouth 2 (two) times daily with a meal. 09/01/16   Tommi Rumps, PA-C    Allergies Patient has no known allergies.  No family history on file.  Social History Social History   Tobacco Use  . Smoking status: Never Smoker  . Smokeless tobacco: Never Used  Substance Use Topics  . Alcohol use: Yes    Frequency: Never  . Drug use: No    Review of Systems Constitutional: Myalgias, general malaise and fatigue. Eyes: No visual changes. ENT: Mild sore throat.  Nasal congestion and runny nose. Cardiovascular: No chest pain, some pressure associated with cough. Respiratory: Cough, occasional shortness of breath associated with cough and/or exertion. Gastrointestinal: No abdominal pain.  Nausea, vomiting, and diarrhea as described above. Genitourinary: Negative for dysuria. Musculoskeletal: Generalized body aches without any specific location.  Negative for neck pain.  Negative for back pain. Integumentary: Negative for rash. Neurological: Occasional mild generalized headache.  No focal weakness or numbness.   ____________________________________________   PHYSICAL  EXAM:  VITAL SIGNS: ED Triage Vitals [05/24/18 2216]  Enc Vitals Group     BP (!) 141/86     Pulse Rate (!) 104     Resp 20     Temp 98.3 F (36.8 C)     Temp Source Oral     SpO2 100 %     Weight 108.9 kg (240 lb)     Height 1.702 m (5\' 7" )     Head Circumference      Peak Flow      Pain Score 8     Pain Loc      Pain Edu?       Excl. in GC?     Constitutional: Alert and oriented. Well appearing and in no acute distress. Eyes: Conjunctivae are normal.  Head: Atraumatic. Nose: +congestion/rhinnorhea. Mouth/Throat: Mucous membranes are moist.  Oropharynx non-erythematous. Neck: No stridor.  No meningeal signs.   Cardiovascular: Mild tachycardia, regular rhythm. Good peripheral circulation. Grossly normal heart sounds. Respiratory: Normal respiratory effort.  No retractions. Lungs CTAB. Gastrointestinal: Soft and nontender. No distention.  Musculoskeletal: No lower extremity tenderness nor edema. No gross deformities of extremities. Neurologic:  Normal speech and language. No gross focal neurologic deficits are appreciated.  Skin:  Skin is warm, dry and intact. No rash noted. Psychiatric: Mood and affect are normal. Speech and behavior are normal.  ____________________________________________   LABS (all labs ordered are listed, but only abnormal results are displayed)  Labs Reviewed  CBC - Abnormal; Notable for the following components:      Result Value   Hemoglobin 10.5 (*)    HCT 33.8 (*)    All other components within normal limits  COMPREHENSIVE METABOLIC PANEL - Abnormal; Notable for the following components:   Glucose, Bld 101 (*)    Calcium 8.5 (*)    Total Protein 6.4 (*)    All other components within normal limits  URINALYSIS, COMPLETE (UACMP) WITH MICROSCOPIC - Abnormal; Notable for the following components:   Color, Urine YELLOW (*)    APPearance CLEAR (*)    Hgb urine dipstick MODERATE (*)    Leukocytes, UA TRACE (*)    Bacteria, UA RARE (*)    All other components within normal limits  LIPASE, BLOOD  POCT PREGNANCY, URINE  POC URINE PREG, ED   ____________________________________________  EKG  No indication for EKG ____________________________________________  RADIOLOGY I, Loleta Roseory [place], personally viewed and evaluated these images (plain radiographs) as part of my medical  decision making, as well as reviewing the written report by the radiologist.  ED MD interpretation:  No evidence of acute abnormality  Official radiology report(s): Dg Chest 2 View  Result Date: 05/25/2018 CLINICAL DATA:  Cough and nausea and vomiting for 2 days, initial encounter EXAM: CHEST - 2 VIEW COMPARISON:  12/26/2016 FINDINGS: Cardiac shadows within normal limits. The lungs are well aerated bilaterally. No focal infiltrate or sizable effusion is seen. No bony abnormality is seen. IMPRESSION: No active cardiopulmonary disease. Electronically Signed   By: Alcide CleverMark  Lukens M.D.   On: 05/25/2018 01:06    ____________________________________________   PROCEDURES  Critical Care performed: No   Procedure(s) performed:   Procedures   ____________________________________________   INITIAL IMPRESSION / ASSESSMENT AND PLAN / ED COURSE  As part of my medical decision making, I reviewed the following data within the electronic MEDICAL RECORD NUMBER Nursing notes reviewed and incorporated, Labs reviewed , Old chart reviewed, Radiograph reviewed  and Notes from prior ED visits  Differential diagnosis includes, but is not limited to, influenza, influenza-like illness, nonspecific viral illness, pneumonia, much less likely PE or ACS, electrolyte or metabolic abnormality.  The patient is very well-appearing, ambulating without any apparent difficulty, and has a reassuring physical exam with clear lungs, no abdominal tenderness, and no visible or appreciable acute abnormalities on exam.  Given the prevalence of influenza in the community, I strongly believe that is her current issue, but it is so prevalent right now that the hospital system has asked [place] not to test for influenza and less it is in a special population (infants or geriatric patients).  I am checking a chest x-ray but I do not expect there to be pneumonia.  I had my usual customary influenza or influenza-like illness discussion with the  patient.  CBC is normal with no leukocytosis, comprehensive metabolic panel was reassuring, lipase normal, urine pregnancy negative.  She does have a few red blood cells in her urine, but she has no other urinary symptoms and I confirmed with her that she is currently on her menstrual cycle.  Assuming the chest x-ray is negative she is comfortable with the plan for discharge and outpatient follow-up without any medications.  Clinical Course as of May 25 109  Tue May 25, 2018  0109 No indication of pneumonia.  I will discharge for outpatient follow-up as previously planned.  DG Chest 2 View [CF]    Clinical Course User Index [CF] Loleta Rose, MD    ____________________________________________  FINAL CLINICAL IMPRESSION(S) / ED DIAGNOSES  Final diagnoses:  Influenza-like illness     MEDICATIONS GIVEN DURING THIS VISIT:  Medications - No data to display   ED Discharge Orders    None       Note:  This document was prepared using Dragon voice recognition software and may include unintentional dictation errors.   Loleta Rose, MD 05/25/18 0111

## 2018-05-25 NOTE — Discharge Instructions (Signed)
You were diagnosed with an influenza-like viral illness.  You will feel ill for as much as a few weeks.  Please take any prescribed medications as instructed, and you may use over-the-counter Tylenol and/or ibuprofen as needed according to label instructions (unless you have an allergy to either or have been told by your doctor not to take them).  Please make sure to drink plenty of fluids and refer to the included information about rehydration. ° °Follow up with your physician as instructed above, and return to the Emergency Department (ED) if you are unable to tolerate fluids due to vomiting, have worsening trouble breathing, become extremely tired or difficult to awaken, or if you develop any other symptoms that concern you. ° °

## 2018-05-25 NOTE — ED Notes (Signed)
Pt states she has cough and congestion for 2-3 days.  Pt reports bodyaches, chills.  N/v today.  Pt alert  Speech clear.

## 2019-02-06 ENCOUNTER — Encounter: Payer: Self-pay | Admitting: Emergency Medicine

## 2019-02-06 ENCOUNTER — Other Ambulatory Visit: Payer: Self-pay

## 2019-02-06 ENCOUNTER — Emergency Department
Admission: EM | Admit: 2019-02-06 | Discharge: 2019-02-06 | Disposition: A | Discharge status: Home / Self Care | Payer: No Typology Code available for payment source | Attending: Emergency Medicine | Admitting: Emergency Medicine

## 2019-02-06 DIAGNOSIS — R0981 Nasal congestion: Secondary | ICD-10-CM | POA: Diagnosis present

## 2019-02-06 DIAGNOSIS — J069 Acute upper respiratory infection, unspecified: Secondary | ICD-10-CM | POA: Insufficient documentation

## 2019-02-06 MED ORDER — BENZONATATE 200 MG PO CAPS: 200.0000 mg | ORAL_CAPSULE | Freq: Three times a day (TID) | ORAL | 0 refills | Status: AC | PRN

## 2019-02-06 NOTE — ED Triage Notes (Addendum)
Pt c/o of head congestion and cough for the last 2 days, reports nasal drainage yellow in color  Denies hx of allergies, fever, loss of taste or smell, or sweats

## 2019-02-06 NOTE — ED Provider Notes (Signed)
[facility] Emergency Department Provider Note  ____________________________________________   First MD Initiated Contact with Patient 02/06/19 [phone]     (approximate)  I have reviewed the triage vital signs and the nursing notes.   HISTORY  Chief Complaint Nasal Congestion  HPI Holly Williamson is a 38 y.o. female presents to the ED with complaint of head congestion and cough for the last 2 days.  Patient states that there is been yellow nasal drainage.  She denies any fever, chills, nausea or vomiting.  There is been no loss of taste or smell.  Patient also reports sneezing and symptoms are mostly relieved by DayQuil extra strength.  She denies any other Covid symptoms and is unaware of any exposure to Covid.     History reviewed. No pertinent past medical history.  There are no active problems to display for this patient.   Past Surgical History:  Procedure Laterality Date  . KNEE ARTHROSCOPY W/ ACL RECONSTRUCTION      Prior to Admission medications   Medication Sig Start Date End Date Taking? Authorizing Provider  albuterol (PROVENTIL HFA;VENTOLIN HFA) 108 (90 Base) MCG/ACT inhaler Inhale 2 puffs into the lungs every 4 (four) hours as needed for wheezing or shortness of breath. 12/26/16   Irean Hong, MD  benzonatate (TESSALON) 200 MG capsule Take 1 capsule (200 mg total) by mouth 3 (three) times daily as needed for cough. 02/06/19   Tommi Rumps, PA-C    Allergies Patient has no known allergies.  History reviewed. No pertinent family history.  Social History Social History   Tobacco Use  . Smoking status: Never Smoker  . Smokeless tobacco: Never Used  Substance Use Topics  . Alcohol use: Yes    Frequency: Never  . Drug use: No    Review of Systems Constitutional: No fever/chills Eyes: No visual changes. ENT: No sore throat.  Positive for nasal congestion. Cardiovascular: Denies chest pain. Respiratory: Denies shortness of  breath.  Positive for cough. Gastrointestinal: No abdominal pain.  No nausea, no vomiting.  No diarrhea.  Musculoskeletal: Negative for body aches. Skin: Negative for rash. Neurological: Negative for headaches, focal weakness or numbness. ___________________________________________   PHYSICAL EXAM:  VITAL SIGNS: ED Triage Vitals  Enc Vitals Group     BP 02/06/19 0516 125/90     Pulse Rate 02/06/19 0516 86     Resp 02/06/19 0516 16     Temp 02/06/19 0516 98.5 F (36.9 C)     Temp Source 02/06/19 0516 Oral     SpO2 02/06/19 0516 99 %     Weight 02/06/19 0517 220 lb (99.8 kg)     Height 02/06/19 0517 5\' 7"  (1.702 m)     Head Circumference --      Peak Flow --      Pain Score 02/06/19 0518 0     Pain Loc --      Pain Edu? --      Excl. in [place]? --    Constitutional: Alert and oriented. Well appearing and in no acute distress. Eyes: Conjunctivae are normal.  Head: Atraumatic. Nose: Minimal congestion/no rhinnorhea. Mouth/Throat: Mucous membranes are moist.  Oropharynx non-erythematous. Neck: No stridor.   Hematological/Lymphatic/Immunilogical: No cervical lymphadenopathy. Cardiovascular: Normal rate, regular rhythm. Grossly normal heart sounds.  Good peripheral circulation. Respiratory: Normal respiratory effort.  No retractions. Lungs CTAB. Musculoskeletal: Moves upper and lower extremities without difficulty.  Normal gait was noted. Neurologic:  Normal speech and language. No gross focal neurologic  deficits are appreciated. No gait instability. Skin:  Skin is warm, dry and intact. No rash noted. Psychiatric: Mood and affect are normal. Speech and behavior are normal.  ____________________________________________   LABS (all labs ordered are listed, but only abnormal results are displayed)  Labs Reviewed - No data to display   PROCEDURES  Procedure(s) performed (including Critical Care):  Procedures   ____________________________________________   INITIAL  IMPRESSION / ASSESSMENT AND PLAN / ED COURSE  As part of my medical decision making, I reviewed the following data within the electronic MEDICAL RECORD NUMBER Notes from prior ED visits and [place] Controlled Substance Database  37 year old female presents to the ED with complaint of nasal congestion, sneezing, cough without fever, chills, sore throat or GI symptoms.  Patient denies any Covid exposure and states that currently her partner is only sneezing and has seasonal allergies.  Patient was told of the free Covid testing available to her.  O2 level was stable at 99% to 100% and patient was afebrile.  Patient will continue with the DayQuil extra strength as needed for nasal congestion.  A prescription for Tessalon Perles 200 mg every 8 hours as needed for cough was sent to her pharmacy.  She is to return to the emergency department or [facility] clinic if any continued problems.  ____________________________________________   FINAL CLINICAL IMPRESSION(S) / ED DIAGNOSES  Final diagnoses:  Acute upper respiratory infection     ED Discharge Orders         Ordered    benzonatate (TESSALON) 200 MG capsule  3 times daily PRN     02/06/19 0743           Note:  This document was prepared using Dragon voice recognition software and may include unintentional dictation errors.    Johnn Hai, PA-C 02/06/19 1032    Vanessa [place], MD 02/06/19 1135

## 2019-02-06 NOTE — ED Notes (Signed)
Pt verbalized understanding of discharge instructions. NAD at this time. 

## 2019-02-06 NOTE — Discharge Instructions (Signed)
Follow-up with your primary care provider or [facility] if any continued problems.  If you wish to have a Covid test the test are done for free at the Covid testing area which is located at the visitor entrance to the hospital Monday through Friday 8:30 am to 3pm.  You may take Tylenol or ibuprofen as needed for headache from congestion.  Continue taking the DayQuil for sinus congestion.  A prescription for Tessalon Perles was sent to your pharmacy to be taken 3 times a day as needed for cough.  This medication will not cause drowsiness and is safe for you to drive while taking it.

## 2019-03-06 ENCOUNTER — Emergency Department
Admission: EM | Admit: 2019-03-06 | Discharge: 2019-03-06 | Disposition: A | Discharge status: Home / Self Care | Payer: No Typology Code available for payment source | Attending: Student in an Organized Health Care Education/Training Program | Admitting: Student in an Organized Health Care Education/Training Program

## 2019-03-06 ENCOUNTER — Encounter: Payer: Self-pay | Admitting: Emergency Medicine

## 2019-03-06 ENCOUNTER — Other Ambulatory Visit: Payer: Self-pay

## 2019-03-06 DIAGNOSIS — U071 COVID-19: Secondary | ICD-10-CM | POA: Insufficient documentation

## 2019-03-06 DIAGNOSIS — R509 Fever, unspecified: Secondary | ICD-10-CM | POA: Diagnosis present

## 2019-03-06 DIAGNOSIS — Z79899 Other long term (current) drug therapy: Secondary | ICD-10-CM | POA: Diagnosis not present

## 2019-03-06 DIAGNOSIS — Z20822 Contact with and (suspected) exposure to covid-19: Secondary | ICD-10-CM

## 2019-03-06 DIAGNOSIS — Z20828 Contact with and (suspected) exposure to other viral communicable diseases: Secondary | ICD-10-CM

## 2019-03-06 LAB — SARS CORONAVIRUS 2 (TAT 6-24 HRS): SARS Coronavirus 2: POSITIVE — AB

## 2019-03-06 NOTE — Discharge Instructions (Addendum)
You are being screened for COVID-19.  Continue to monitor and treat symptoms with over-the-counter medications.  You should remain under house quarantine until test results are verified.  Return to the ED as needed for sharply worsening symptoms. Consider activating your MyChart, to verify test results.

## 2019-03-06 NOTE — ED Triage Notes (Signed)
Pt to ED via POV c/o fever last night, sweating, states that she "feels different". Wants to be tested for COVID. States Mother-in-law tested positive on Tuesday. Pt is in NAD.

## 2019-03-06 NOTE — ED Notes (Signed)
Pt verbalized understanding of discharge instructions. NAD at this time. 

## 2019-03-06 NOTE — ED Provider Notes (Signed)
[facility] Emergency Department Provider Note ____________________________________________  Time seen: 1050  I have reviewed the triage vital signs and the nursing notes.  HISTORY  Chief Complaint  URI   HPI Holly Williamson is a 38 y.o. female presents as up to the ED for evaluation of onset of fever last night.  Patient describes  low-grade fevers as well as sweating.  She describes "feeling different."  She reports today for request for Covid testing.  She reports a positive contact and that her mother-in-law was confirmed last week.  She does admit to loss of taste and smell sensation.  She also reports generalized body aches and malaise.  Denies any frank chest pain, shortness of breath, or vomiting.  History reviewed. No pertinent past medical history.  There are no active problems to display for this patient.   Past Surgical History:  Procedure Laterality Date  . KNEE ARTHROSCOPY W/ ACL RECONSTRUCTION      Prior to Admission medications   Medication Sig Start Date End Date Taking? Authorizing Provider  albuterol (PROVENTIL HFA;VENTOLIN HFA) 108 (90 Base) MCG/ACT inhaler Inhale 2 puffs into the lungs every 4 (four) hours as needed for wheezing or shortness of breath. 12/26/16   Paulette Blanch, MD  benzonatate (TESSALON) 200 MG capsule Take 1 capsule (200 mg total) by mouth 3 (three) times daily as needed for cough. 02/06/19   Johnn Hai, PA-C    Allergies Patient has no known allergies.  No family history on file.  Social History Social History   Tobacco Use  . Smoking status: Never Smoker  . Smokeless tobacco: Never Used  Substance Use Topics  . Alcohol use: Yes    Frequency: Never  . Drug use: No    Review of Systems  Constitutional: Positive for fever. Eyes: Negative for visual changes. ENT: Negative for sore throat. Cardiovascular: Negative for chest pain. Respiratory: Negative for shortness of breath. Gastrointestinal:  Negative for abdominal pain, vomiting and diarrhea. Genitourinary: Negative for dysuria. Musculoskeletal: Negative for back pain. Skin: Negative for rash. Neurological: Negative for headaches, focal weakness or numbness. ____________________________________________  PHYSICAL EXAM:  VITAL SIGNS: ED Triage Vitals  Enc Vitals Group     BP 03/06/19 1035 125/83     Pulse Rate 03/06/19 1035 85     Resp 03/06/19 1035 16     Temp 03/06/19 1035 99 F (37.2 C)     Temp Source 03/06/19 1035 Oral     SpO2 03/06/19 1035 99 %     Weight 03/06/19 1033 215 lb (97.5 kg)     Height 03/06/19 1033 5\' 7"  (1.702 m)     Head Circumference --      Peak Flow --      Pain Score 03/06/19 1033 9     Pain Loc --      Pain Edu? --      Excl. in [place]? --     Constitutional: Alert and oriented. Well appearing and in no distress. Head: Normocephalic and atraumatic. Eyes: Conjunctivae are normal. PERRL. Normal extraocular movements Ears: Canals clear. TMs intact bilaterally. Nose: No congestion/rhinorrhea/epistaxis. Cardiovascular: Normal rate, regular rhythm. Normal distal pulses. Respiratory: Normal respiratory effort. No wheezes/rales/rhonchi. Musculoskeletal: Nontender with normal range of motion in all extremities.  Neurologic:  Normal gait without ataxia. Normal speech and language. No gross focal neurologic deficits are appreciated. Skin:  Skin is warm, dry and intact. No rash noted. ____________________________________________   LABS (pertinent positives/negatives) Labs Reviewed  SARS CORONAVIRUS 2 (  TAT 6-24 HRS)  ____________________________________________  PROCEDURES  Procedures ____________________________________________  INITIAL IMPRESSION / ASSESSMENT AND PLAN / ED COURSE  Patient with ED evaluation and concern over possible Covid exposure.  Patient presents with intermittent low-grade fevers as well as malaise, body aches, and taste sensation.  She is otherwise stable without signs  of acute respiratory distress, or toxic appearance.  She will be discharged with a pending Covid test at this time.  Work note is provided as well as Orthoptist for continued home quarantine until results are validated.  Patient verbalized understanding of discharge instructions and will follow up as directed.  Holly Williamson was evaluated in Emergency Department on 03/06/2019 for the symptoms described in the history of present illness. She was evaluated in the context of the global COVID-19 pandemic, which necessitated consideration that the patient might be at risk for infection with the SARS-CoV-2 virus that causes COVID-19. Institutional protocols and algorithms that pertain to the evaluation of patients at risk for COVID-19 are in a state of rapid change based on information released by regulatory bodies including the CDC and federal and state organizations. These policies and algorithms were followed during the patient's care in the ED. ____________________________________________  FINAL CLINICAL IMPRESSION(S) / ED DIAGNOSES  Final diagnoses:  Exposure to COVID-19 virus     Karmen Stabs, Charlesetta Ivory, PA-C 03/06/19 1125    Willy Eddy, MD 03/06/19 1217

## 2019-03-06 NOTE — ED Notes (Signed)
Pt states mother-in-law tested positive for Covid on Tuesday, now c/o back pain, fatigue, and sore throat. Pt states had fever and sweating last night. Pt states "I just want to get tested". Pt visualized in NAD at this time, respirations even and unlabored at this time, skin warm, dry, and intact.

## 2019-03-07 ENCOUNTER — Telehealth: Payer: Self-pay | Admitting: Emergency Medicine

## 2019-03-07 NOTE — Telephone Encounter (Signed)
Called patient and informed her of positive covid test, isolation and quarantine guidelines.

## 2019-03-14 ENCOUNTER — Emergency Department: Payer: No Typology Code available for payment source

## 2019-03-14 ENCOUNTER — Encounter: Payer: Self-pay | Admitting: Emergency Medicine

## 2019-03-14 ENCOUNTER — Emergency Department
Admission: EM | Admit: 2019-03-14 | Discharge: 2019-03-14 | Disposition: A | Discharge status: Home / Self Care | Payer: No Typology Code available for payment source | Attending: Emergency Medicine | Admitting: Emergency Medicine

## 2019-03-14 DIAGNOSIS — R0602 Shortness of breath: Secondary | ICD-10-CM | POA: Diagnosis present

## 2019-03-14 DIAGNOSIS — Z5321 Procedure and treatment not carried out due to patient leaving prior to being seen by health care provider: Secondary | ICD-10-CM | POA: Insufficient documentation

## 2019-03-14 LAB — BASIC METABOLIC PANEL WITH GFR
Anion gap: 9 (ref 5–15)
BUN: 9 mg/dL (ref 6–20)
CO2: 23 mmol/L (ref 22–32)
Calcium: 9 mg/dL (ref 8.9–10.3)
Chloride: 107 mmol/L (ref 98–111)
Creatinine, Ser: 0.78 mg/dL (ref 0.44–1.00)
GFR calc Af Amer: >60 mL/min
GFR calc non Af Amer: >60 mL/min
Glucose, Bld: 93 mg/dL (ref 70–99)
Potassium: 3.4 mmol/L — ABNORMAL LOW (ref 3.5–5.1)
Sodium: 139 mmol/L (ref 135–145)

## 2019-03-14 LAB — CBC
HCT: 35 % — ABNORMAL LOW (ref 36.0–46.0)
Hemoglobin: 10.8 g/dL — ABNORMAL LOW (ref 12.0–15.0)
MCH: 25.1 pg — ABNORMAL LOW (ref 26.0–34.0)
MCHC: 30.9 g/dL (ref 30.0–36.0)
MCV: 81.4 fL (ref 80.0–100.0)
Platelets: 349 10*3/uL (ref 150–400)
RBC: 4.3 MIL/uL (ref 3.87–5.11)
RDW: 15.2 % (ref 11.5–15.5)
WBC: 6.7 10*3/uL (ref 4.0–10.5)
nRBC: 0 % (ref 0.0–0.2)

## 2019-03-14 LAB — TROPONIN I (HIGH SENSITIVITY): Troponin I (High Sensitivity): 3 ng/L (ref <18)

## 2019-03-14 NOTE — ED Triage Notes (Signed)
Pt reports she was tested in ED on the 22nd, tested COVID +. Pt back to ED tonight due to increased SOB.

## 2019-03-15 ENCOUNTER — Telehealth: Payer: Self-pay | Admitting: Emergency Medicine

## 2019-03-15 NOTE — Telephone Encounter (Signed)
Called patient due to lwot to inquire about condition and follow up plans. She is worried about pneumonia.  I told her that she should do an evisit or phone visit with her doctor.  She is asking about antibiotics and I told her that she should ask that question of the doctor.  I told her that she may always return here if her breathing is severe.

## 2021-01-12 ENCOUNTER — Encounter (HOSPITAL_COMMUNITY): Payer: Self-pay | Admitting: Emergency Medicine

## 2021-01-12 ENCOUNTER — Ambulatory Visit (HOSPITAL_COMMUNITY)
Admission: EM | Admit: 2021-01-12 | Discharge: 2021-01-12 | Disposition: A | Discharge status: Home / Self Care | Payer: 59 | Attending: Physician Assistant | Admitting: Physician Assistant

## 2021-01-12 ENCOUNTER — Other Ambulatory Visit: Payer: Self-pay

## 2021-01-12 DIAGNOSIS — J40 Bronchitis, not specified as acute or chronic: Secondary | ICD-10-CM

## 2021-01-12 MED ORDER — PREDNISONE 20 MG PO TABS: 40.0000 mg | ORAL_TABLET | Freq: Every day | ORAL | 0 refills | Status: AC

## 2021-01-12 NOTE — ED Provider Notes (Signed)
[facility]    CSN: [id number] Arrival date & time: 01/12/21  1529      History   Chief Complaint Chief Complaint  Patient presents with   Cough   Otalgia    HPI Holly Williamson is a 40 y.o. female.   Patient here today for evaluation of cough that has lasted over the last couple weeks.  She reports she has had some wheezing and shortness of breath.  She does have history of bronchitis but not asthma.  She has not had any fever or chills.  She denies any abdominal pain, nausea, vomiting or diarrhea.  She has tried multiple over-the-counter cough medications without significant relief.   Cough Associated symptoms: ear pain, shortness of breath and wheezing   Associated symptoms: no chills, no fever and no sore throat   Otalgia Associated symptoms: cough   Associated symptoms: no abdominal pain, no congestion, no diarrhea, no fever, no sore throat and no vomiting    History reviewed. No pertinent past medical history.  There are no problems to display for this patient.   Past Surgical History:  Procedure Laterality Date   KNEE ARTHROSCOPY W/ ACL RECONSTRUCTION      OB History   No obstetric history on file.      Home Medications    Prior to Admission medications   Medication Sig Start Date End Date Taking? Authorizing Provider  predniSONE (DELTASONE) 20 MG tablet Take 2 tablets (40 mg total) by mouth daily with breakfast for 5 days. 01/12/21 01/17/21 Yes Tomi Bamberger, PA-C  albuterol (PROVENTIL HFA;VENTOLIN HFA) 108 (90 Base) MCG/ACT inhaler Inhale 2 puffs into the lungs every 4 (four) hours as needed for wheezing or shortness of breath. 12/26/16   Irean Hong, MD  benzonatate (TESSALON) 200 MG capsule Take 1 capsule (200 mg total) by mouth 3 (three) times daily as needed for cough. 02/06/19   Tommi Rumps, PA-C    Family History History reviewed. No pertinent family history.  Social History Social History   Tobacco Use   Smoking status:  Never   Smokeless tobacco: Never  Vaping Use   Vaping Use: Never used  Substance Use Topics   Alcohol use: Yes   Drug use: No     Allergies   Patient has no known allergies.   Review of Systems Review of Systems  Constitutional:  Negative for chills and fever.  HENT:  Positive for ear pain. Negative for congestion and sore throat.   Respiratory:  Positive for cough, shortness of breath and wheezing.   Gastrointestinal:  Negative for abdominal pain, diarrhea, nausea and vomiting.    Physical Exam Triage Vital Signs ED Triage Vitals  Enc Vitals Group     BP 01/12/21 1630 (!) 167/104     Pulse Rate 01/12/21 1630 84     Resp --      Temp 01/12/21 1630 98.6 F (37 C)     Temp Source 01/12/21 1630 Oral     SpO2 01/12/21 1630 98 %     Weight --      Height --      Head Circumference --      Peak Flow --      Pain Score 01/12/21 1629 9     Pain Loc --      Pain Edu? --      Excl. in [place]? --    No data found.  Updated Vital Signs BP (!) 167/104 (BP Location: Right Arm)  Pulse 84   Temp 98.6 F (37 C) (Oral)   LMP 12/22/2020   SpO2 98%      Physical Exam Vitals and nursing note reviewed.  Constitutional:      General: She is not in acute distress.    Appearance: Normal appearance. She is not ill-appearing.  HENT:     Head: Normocephalic and atraumatic.     Right Ear: Tympanic membrane normal.     Left Ear: Tympanic membrane normal.     Nose: Congestion present.     Mouth/Throat:     Mouth: Mucous membranes are moist.     Pharynx: No oropharyngeal exudate or posterior oropharyngeal erythema.  Eyes:     Conjunctiva/sclera: Conjunctivae normal.  Cardiovascular:     Rate and Rhythm: Normal rate and regular rhythm.     Heart sounds: Normal heart sounds. No murmur heard. Pulmonary:     Effort: Pulmonary effort is normal. No respiratory distress.     Breath sounds: Normal breath sounds. No wheezing, rhonchi or rales.     Comments: Deep breathing produces  cough Skin:    General: Skin is warm and dry.  Neurological:     Mental Status: She is alert.  Psychiatric:        Mood and Affect: Mood normal.        Thought Content: Thought content normal.     UC Treatments / Results  Labs (all labs ordered are listed, but only abnormal results are displayed) Labs Reviewed - No data to display  EKG   Radiology No results found.  Procedures Procedures (including critical care time)  Medications Ordered in UC Medications - No data to display  Initial Impression / Assessment and Plan / UC Course  I have reviewed the triage vital signs and the nursing notes.  Pertinent labs & imaging results that were available during my care of the patient were reviewed by me and considered in my medical decision making (see chart for details).  Suspect likely bronchitis.  Will treat with steroid burst and recommended follow-up if no gradual improvement or if symptoms worsen anyway.  Final Clinical Impressions(s) / UC Diagnoses   Final diagnoses:  Bronchitis   Discharge Instructions   None    ED Prescriptions     Medication Sig Dispense Auth. Provider   predniSONE (DELTASONE) 20 MG tablet Take 2 tablets (40 mg total) by mouth daily with breakfast for 5 days. 10 tablet Tomi Bamberger, PA-C      PDMP not reviewed this encounter.   Tomi Bamberger, PA-C 01/12/21 1734

## 2021-01-12 NOTE — ED Triage Notes (Signed)
Pt c/o cough and right ear pain for over a week

## 2021-06-14 ENCOUNTER — Other Ambulatory Visit: Payer: Self-pay

## 2021-06-14 ENCOUNTER — Encounter (HOSPITAL_COMMUNITY): Payer: Self-pay

## 2021-06-14 ENCOUNTER — Ambulatory Visit (INDEPENDENT_AMBULATORY_CARE_PROVIDER_SITE_OTHER): Payer: 59

## 2021-06-14 ENCOUNTER — Ambulatory Visit (HOSPITAL_COMMUNITY)
Admission: EM | Admit: 2021-06-14 | Discharge: 2021-06-14 | Disposition: A | Discharge status: Home / Self Care | Payer: 59 | Attending: Family Medicine | Admitting: Family Medicine

## 2021-06-14 DIAGNOSIS — M25562 Pain in left knee: Secondary | ICD-10-CM | POA: Diagnosis not present

## 2021-06-14 DIAGNOSIS — S8392XA Sprain of unspecified site of left knee, initial encounter: Secondary | ICD-10-CM | POA: Diagnosis not present

## 2021-06-14 DIAGNOSIS — S8992XA Unspecified injury of left lower leg, initial encounter: Secondary | ICD-10-CM | POA: Diagnosis not present

## 2021-06-14 MED ORDER — IBUPROFEN 800 MG PO TABS: 800.0000 mg | ORAL_TABLET | Freq: Three times a day (TID) | ORAL | 0 refills | Status: AC | PRN

## 2021-06-14 NOTE — Discharge Instructions (Addendum)
Take medication as prescribed.  Side effects discussed. ?RICE therapy to the affected knee.  Apply ice, on for 20 minutes, off for 1 hour, then repeat.  Wear knee brace until seen by orthopedics. ?May follow-up with providers within the [facility] system or follow-up with previous orthopedics at [facility]. ?Follow-up if symptoms worsen or do not improve. ? ?

## 2021-06-14 NOTE — ED Provider Notes (Signed)
?[facility] ? ? ? ?CSN: [id number] ?Arrival date & time: 06/14/21  1450 ? ? ?  ? ?History   ?Chief Complaint ?Chief Complaint  ?Patient presents with  ? Knee Pain  ? ? ?HPI ?Holly Williamson is a 41 y.o. female.  ? ?The patient is a 41 year old female who presents for left knee pain.  Symptoms started when she was trying to move a pool table in her garage to 3 days ago.  Patient states when she was moving the pool table the left knee twisted outward, and she states she felt a "pop".  Since that time she has developed swelling, pain, and decreased range of motion of the left knee.  Pain worsens with ambulation, full extension of the left leg, and with most all range of motion.  Denies instability or weakness of the left lower extremity.  She reports that she has been taking ibuprofen and has been icing and elevating the left knee without relief.  She states she has a prior history of a right MCL tear and symptoms feel very similar to that injury. ? ? ? ?History reviewed. No pertinent past medical history. ? ?There are no problems to display for this patient. ? ? ?Past Surgical History:  ?Procedure Laterality Date  ? KNEE ARTHROSCOPY W/ ACL RECONSTRUCTION    ? ? ?OB History   ?No obstetric history on file. ?  ? ? ? ?Home Medications   ? ?Prior to Admission medications   ?Medication Sig Start Date End Date Taking? Authorizing Provider  ?ibuprofen (ADVIL) 800 MG tablet Take 1 tablet (800 mg total) by mouth every 8 (eight) hours as needed for up to 10 days. 06/14/21 06/24/21 Yes Bindi Klomp-Warren, Sadie Haber, NP  ?albuterol (PROVENTIL HFA;VENTOLIN HFA) 108 (90 Base) MCG/ACT inhaler Inhale 2 puffs into the lungs every 4 (four) hours as needed for wheezing or shortness of breath. 12/26/16   Irean Hong, MD  ?benzonatate (TESSALON) 200 MG capsule Take 1 capsule (200 mg total) by mouth 3 (three) times daily as needed for cough. 02/06/19   Tommi Rumps, PA-C  ? ? ?Family History ?History reviewed. No pertinent family  history. ? ?Social History ?Social History  ? ?Tobacco Use  ? Smoking status: Never  ? Smokeless tobacco: Never  ?Vaping Use  ? Vaping Use: Never used  ?Substance Use Topics  ? Alcohol use: Yes  ? Drug use: No  ? ? ? ?Allergies   ?Patient has no known allergies. ? ? ?Review of Systems ?Review of Systems  ?Constitutional: Negative.   ?Musculoskeletal:  Positive for joint swelling.  ?     Left knee pain  ?Skin: Negative.   ?Psychiatric/Behavioral: Negative.    ? ? ?Physical Exam ?Triage Vital Signs ?ED Triage Vitals  ?Enc Vitals Group  ?   BP 06/14/21 1522 (!) 156/99  ?   Pulse Rate 06/14/21 1522 85  ?   Resp 06/14/21 1522 16  ?   Temp 06/14/21 1522 98 ?F (36.7 ?C)  ?   Temp Source 06/14/21 1522 Oral  ?   SpO2 06/14/21 1522 98 %  ?   Weight --   ?   Height --   ?   Head Circumference --   ?   Peak Flow --   ?   Pain Score 06/14/21 1521 10  ?   Pain Loc --   ?   Pain Edu? --   ?   Excl. in GC? --   ? ?No data  found. ? ?Updated Vital Signs ?BP (!) 156/99 (BP Location: Right Arm)   Pulse 85   Temp 98 ?F (36.7 ?C) (Oral)   Resp 16   LMP 06/08/2021   SpO2 98%  ? ?Visual Acuity ?Right Eye Distance:   ?Left Eye Distance:   ?Bilateral Distance:   ? ?Right Eye Near:   ?Left Eye Near:    ?Bilateral Near:    ? ?Physical Exam ?Constitutional:   ?   General: She is not in acute distress. ?   Appearance: Normal appearance.  ?HENT:  ?   Head: Normocephalic.  ?Eyes:  ?   Extraocular Movements: Extraocular movements intact.  ?Musculoskeletal:     ?   General: Swelling and tenderness present.  ?   Left knee: Swelling and bony tenderness present. Decreased range of motion. Tenderness present over the MCL, LCL and PCL.  ?   Comments: + Valgus and Varus stress. Other instability tests deferred due to pain.   ?Neurological:  ?   Mental Status: She is alert and oriented to person, place, and time.  ? ? ? ?UC Treatments / Results  ?Labs ?(all labs ordered are listed, but only abnormal results are displayed) ?Labs Reviewed - No data to  display ? ?EKG ? ? ?Radiology ?DG Knee Complete 4 Views Left ? ?Result Date: 06/14/2021 ?CLINICAL DATA:  Left knee pain, lifting injury, twisted knee EXAM: LEFT KNEE - COMPLETE 4+ VIEW COMPARISON:  10/17/2017 FINDINGS: Frontal, bilateral oblique, lateral views of the left knee are obtained. No acute fracture, subluxation, or dislocation. Three compartmental osteoarthritis, greatest in the medial compartment with moderate joint space narrowing and osteophyte formation. No joint effusion. Soft tissues are unremarkable. IMPRESSION: 1. 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 2. No acute bony abnormality. Electronically Signed   By: Sharlet Salina M.D.   On: 06/14/2021 15:37   ? ?Procedures ?Procedures (including critical care time) ? ?Medications Ordered in UC ?Medications - No data to display ? ?Initial Impression / Assessment and Plan / UC Course  ?I have reviewed the triage vital signs and the nursing notes. ? ?Pertinent labs & imaging results that were available during my care of the patient were reviewed by me and considered in my medical decision making (see chart for details). ? ?Symptoms are consistent with a left knee sprain.  X-rays do not indicate any fracture or dislocation or effusion, but some degenerative changes are present.  However, because she indicates that she felt a pop with the injury, she most likely will need an MRI to provide a definitive diagnosis.  In the interim, we will provide a hinged knee brace to add support and stability for the left knee.  Will recommend RICE therapy until she can be seen by Ortho, and we will also provide a work note for the patient.  Recommend application of ice, on for 20 minutes, off for 1 hour, and then repeat is much as possible.  Will provide prescription for ibuprofen 800 mg to take to help with pain, and swelling.  Side effects discussed with the patient.  Recommended follow-up with orthopedics as soon as possible.  ? ? ?Final  Clinical Impressions(s) / UC Diagnoses  ? ?Final diagnoses:  ?Left knee injury, initial encounter  ?Sprain of left knee, unspecified ligament, initial encounter  ? ? ? ?Discharge Instructions   ? ?  ?Take medication as prescribed.  Side effects discussed. ?RICE therapy to the affected knee.  Apply ice, on for 20 minutes, off for  1 hour, then repeat.  Wear knee brace until seen by orthopedics. ?May follow-up with providers within the [facility] system or follow-up with previous orthopedics at [facility]. ?Follow-up if symptoms worsen or do not improve. ? ? ? ? ? ?ED Prescriptions   ? ? Medication Sig Dispense Auth. Provider  ? ibuprofen (ADVIL) 800 MG tablet Take 1 tablet (800 mg total) by mouth every 8 (eight) hours as needed for up to 10 days. 30 tablet Shanisha Lech-Warren, Sadie Haber, NP  ? ?  ? ?PDMP not reviewed this encounter. ?  ?Abran Cantor, NP ?06/14/21 1605 ? ?

## 2021-06-14 NOTE — ED Triage Notes (Signed)
Patient presents to Urgent Care with complaints of left knee pain from heavy lifting. Pt is concerned with a sprain. Treating pain with motrin. Pain increases with ambulation.  ?

## 2021-09-16 ENCOUNTER — Ambulatory Visit: Payer: Self-pay | Admitting: Family Medicine

## 2021-09-17 ENCOUNTER — Ambulatory Visit: Payer: 59 | Admitting: Nurse Practitioner

## 2021-10-28 ENCOUNTER — Emergency Department
Admission: EM | Admit: 2021-10-28 | Discharge: 2021-10-28 | Disposition: A | Discharge status: Home / Self Care | Payer: 59 | Source: Home / Self Care

## 2022-11-28 IMAGING — DX DG KNEE COMPLETE 4+V*L*
4 series · 4 of 4 positions shown · non-contrast
Comparison: 10/17/2017

CLINICAL DATA: Left knee pain, lifting injury, twisted knee

EXAM:
LEFT KNEE - COMPLETE 4+ VIEW

[knee ap]
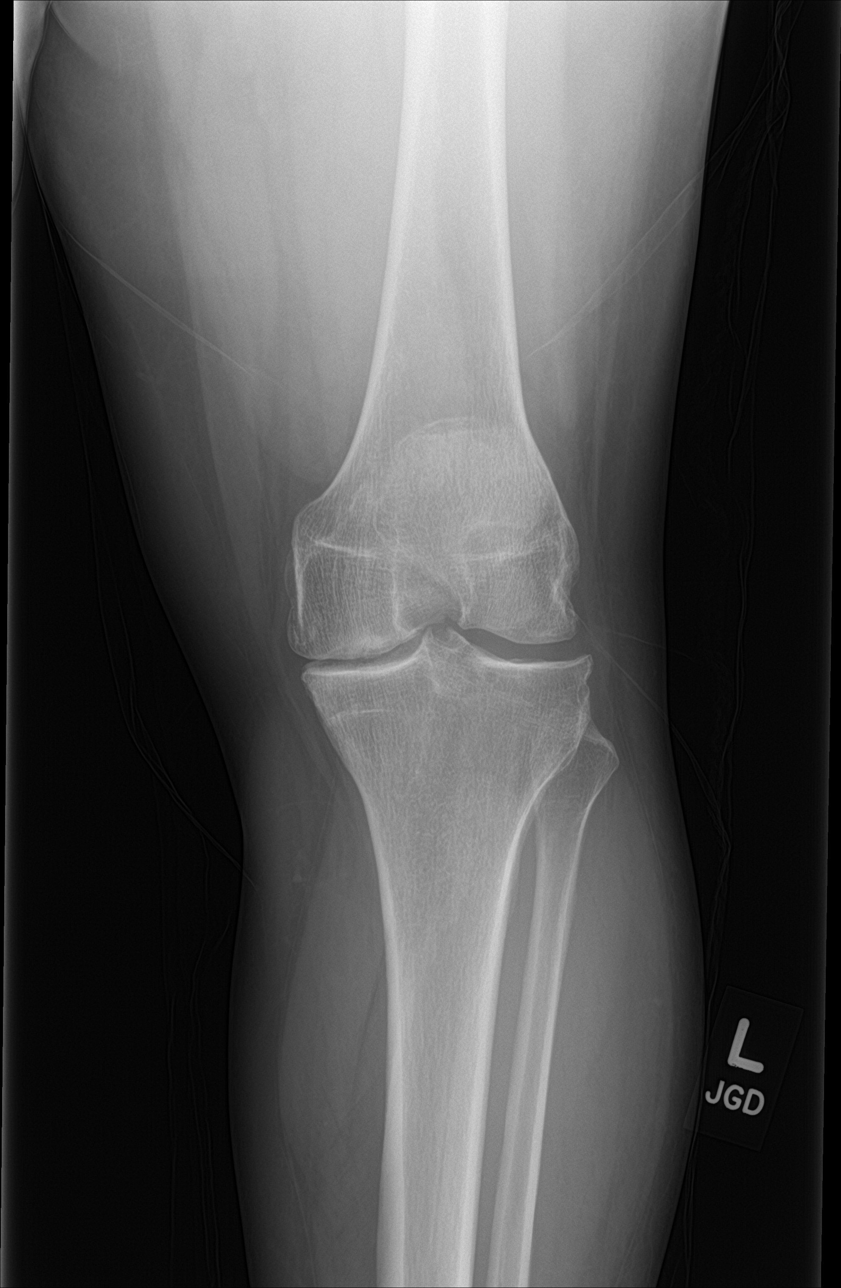

[knee obl (1 of 2)]
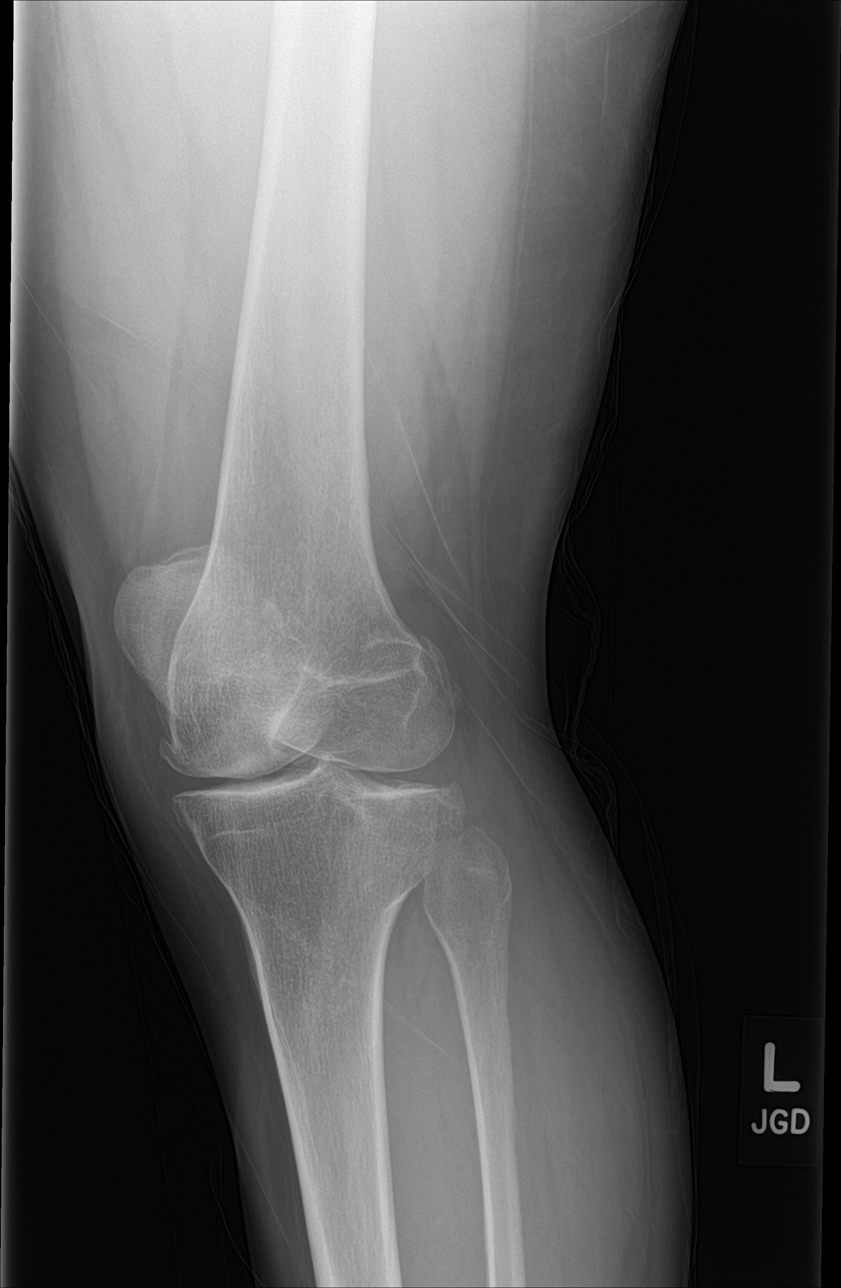

[knee obl (2 of 2)]
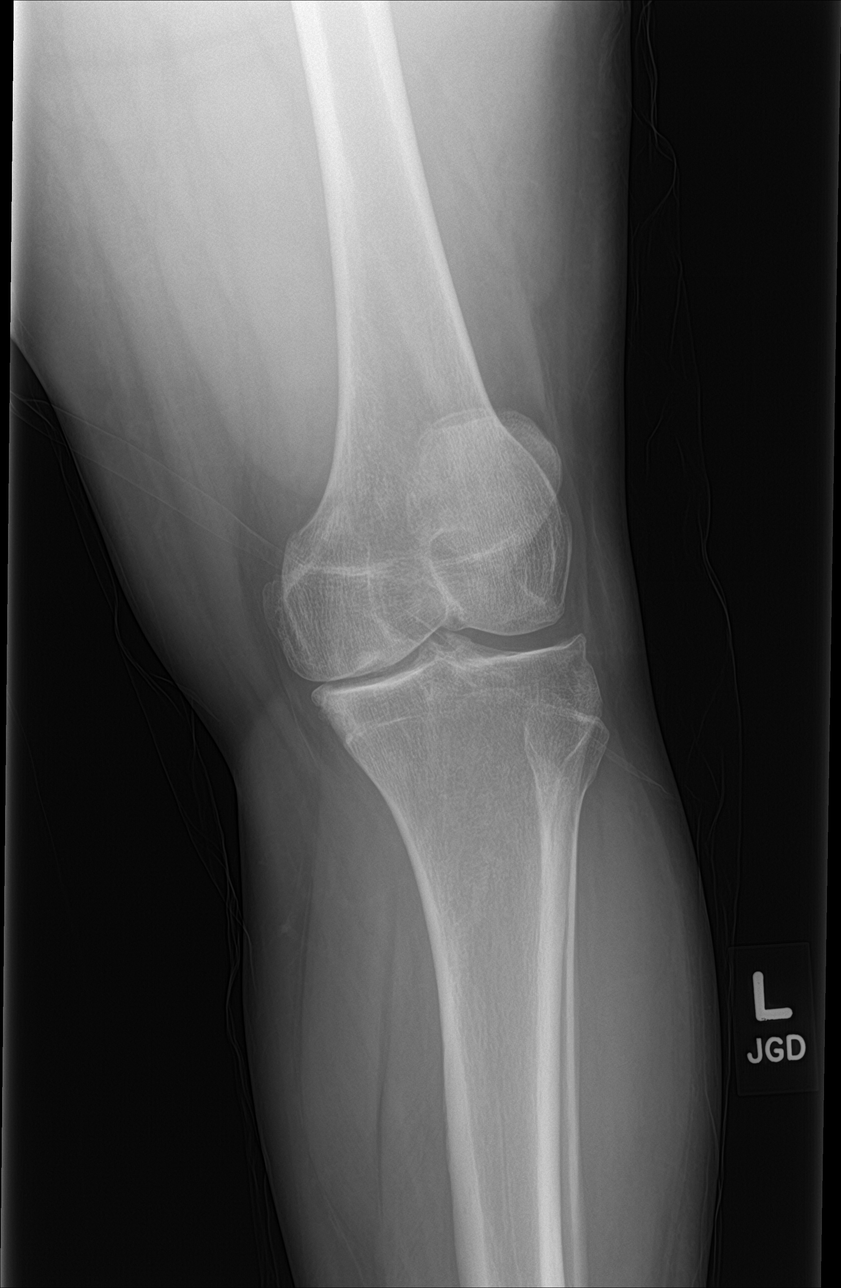

[knee lat]
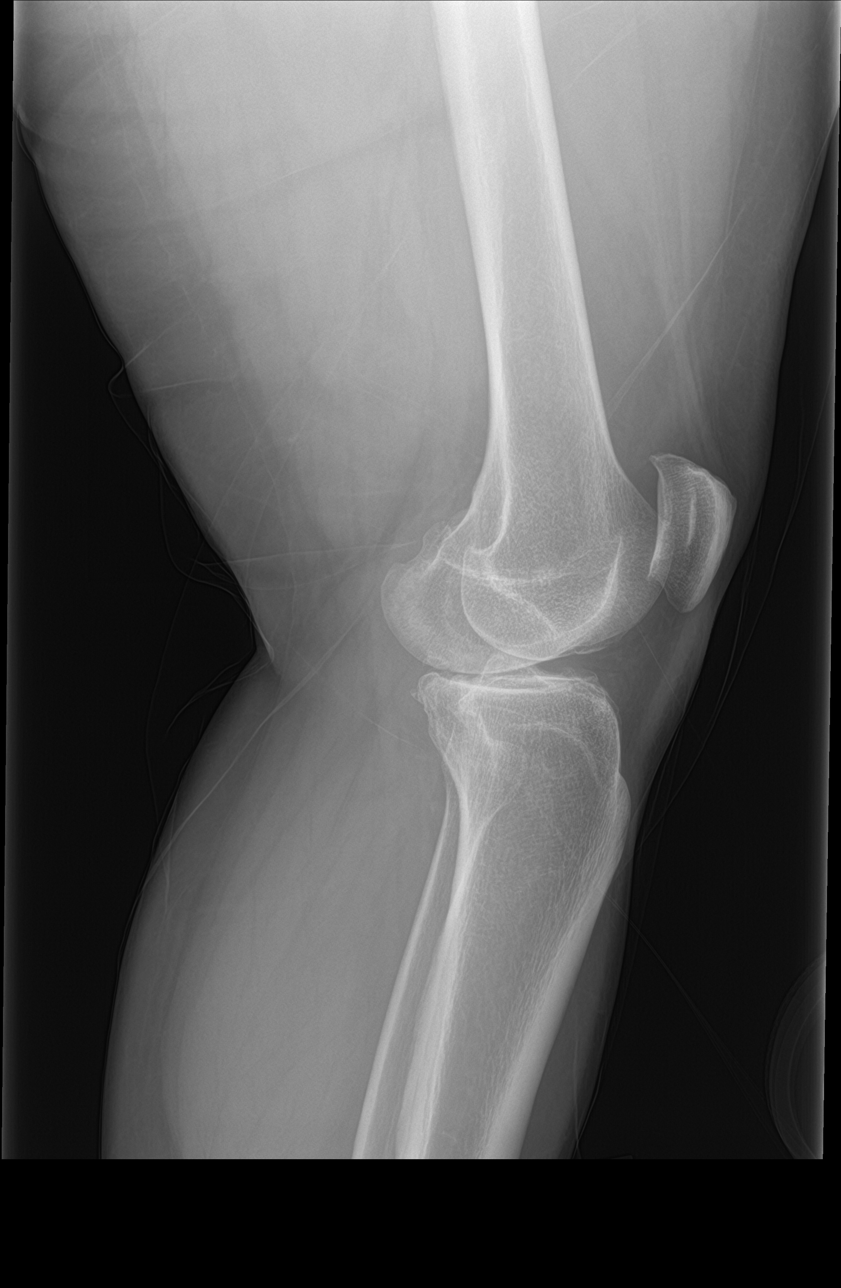

[4 of 4 positions shown; findings below may reference images not displayed]

FINDINGS: Frontal, bilateral oblique, lateral views of the left knee are
obtained. No acute fracture, subluxation, or dislocation. Three
compartmental osteoarthritis, greatest in the medial compartment
with moderate joint space narrowing and osteophyte formation. No
joint effusion. Soft tissues are unremarkable.
IMPRESSION: 1. Three compartmental osteoarthritis greatest in the medial
compartment.
2. No acute bony abnormality.
